# Patient Record
Sex: Female | Born: 1940 | Race: White | Hispanic: No | Marital: Married | Smoking: Former smoker
Health system: Southern US, Community
[De-identification: ages and names within clinical notes are randomized; demographics above are authoritative.]

## PROBLEM LIST (undated history)

## (undated) DIAGNOSIS — E785 Hyperlipidemia, unspecified: Secondary | ICD-10-CM

## (undated) DIAGNOSIS — I214 Non-ST elevation (NSTEMI) myocardial infarction: Secondary | ICD-10-CM

## (undated) DIAGNOSIS — R251 Tremor, unspecified: Secondary | ICD-10-CM

## (undated) DIAGNOSIS — C3490 Malignant neoplasm of unspecified part of unspecified bronchus or lung: Secondary | ICD-10-CM

## (undated) DIAGNOSIS — J449 Chronic obstructive pulmonary disease, unspecified: Secondary | ICD-10-CM

## (undated) DIAGNOSIS — I447 Left bundle-branch block, unspecified: Secondary | ICD-10-CM

## (undated) DIAGNOSIS — G250 Essential tremor: Principal | ICD-10-CM

## (undated) DIAGNOSIS — R918 Other nonspecific abnormal finding of lung field: Secondary | ICD-10-CM

## (undated) DIAGNOSIS — Z720 Tobacco use: Secondary | ICD-10-CM

## (undated) HISTORY — DX: Tobacco use: Z72.0

## (undated) HISTORY — DX: Hyperlipidemia, unspecified: E78.5

## (undated) HISTORY — DX: Chronic obstructive pulmonary disease, unspecified: J44.9

## (undated) HISTORY — DX: Essential tremor: G25.0

## (undated) HISTORY — DX: Non-ST elevation (NSTEMI) myocardial infarction: I21.4

## (undated) HISTORY — PX: HEEL SPUR SURGERY: SHX665

## (undated) HISTORY — DX: Malignant neoplasm of unspecified part of unspecified bronchus or lung: C34.90

## (undated) HISTORY — DX: Left bundle-branch block, unspecified: I44.7

## (undated) HISTORY — PX: BREAST BIOPSY: SHX20

## (undated) HISTORY — PX: OTHER SURGICAL HISTORY: SHX169

## (undated) HISTORY — PX: CHOLECYSTECTOMY: SHX55

## (undated) HISTORY — PX: TUBAL LIGATION: SHX77

---

## 2001-03-16 ENCOUNTER — Ambulatory Visit (HOSPITAL_COMMUNITY): Admission: RE | Admit: 2001-03-16 | Discharge: 2001-03-16 | Payer: Self-pay | Admitting: Pulmonary Disease

## 2001-03-30 ENCOUNTER — Ambulatory Visit (HOSPITAL_COMMUNITY): Admission: RE | Admit: 2001-03-30 | Discharge: 2001-03-30 | Payer: Self-pay | Admitting: Pulmonary Disease

## 2002-05-22 ENCOUNTER — Ambulatory Visit (HOSPITAL_COMMUNITY): Admission: RE | Admit: 2002-05-22 | Discharge: 2002-05-22 | Payer: Self-pay | Admitting: Pulmonary Disease

## 2009-06-10 ENCOUNTER — Ambulatory Visit (HOSPITAL_COMMUNITY): Admission: RE | Admit: 2009-06-10 | Discharge: 2009-06-10 | Payer: Self-pay | Admitting: Pulmonary Disease

## 2009-06-16 ENCOUNTER — Ambulatory Visit (HOSPITAL_COMMUNITY): Admission: RE | Admit: 2009-06-16 | Discharge: 2009-06-16 | Payer: Self-pay | Admitting: Pulmonary Disease

## 2010-06-18 ENCOUNTER — Ambulatory Visit (HOSPITAL_COMMUNITY): Admission: RE | Admit: 2010-06-18 | Discharge: 2010-06-18 | Payer: Self-pay | Admitting: Pulmonary Disease

## 2011-06-09 ENCOUNTER — Other Ambulatory Visit (HOSPITAL_COMMUNITY): Payer: Self-pay | Admitting: Pulmonary Disease

## 2011-06-09 DIAGNOSIS — Z139 Encounter for screening, unspecified: Secondary | ICD-10-CM

## 2011-06-21 ENCOUNTER — Ambulatory Visit (HOSPITAL_COMMUNITY)
Admission: RE | Admit: 2011-06-21 | Discharge: 2011-06-21 | Disposition: A | Discharge status: Home / Self Care | Payer: Medicare FFS | Source: Ambulatory Visit | Attending: Pulmonary Disease | Admitting: Pulmonary Disease

## 2011-06-21 DIAGNOSIS — Z1231 Encounter for screening mammogram for malignant neoplasm of breast: Secondary | ICD-10-CM | POA: Insufficient documentation

## 2011-06-21 DIAGNOSIS — Z139 Encounter for screening, unspecified: Secondary | ICD-10-CM

## 2012-01-07 ENCOUNTER — Encounter: Payer: Self-pay | Admitting: Cardiology

## 2012-01-09 DIAGNOSIS — R079 Chest pain, unspecified: Secondary | ICD-10-CM | POA: Insufficient documentation

## 2012-01-09 DIAGNOSIS — J449 Chronic obstructive pulmonary disease, unspecified: Secondary | ICD-10-CM | POA: Insufficient documentation

## 2012-01-09 DIAGNOSIS — E785 Hyperlipidemia, unspecified: Secondary | ICD-10-CM | POA: Insufficient documentation

## 2012-01-10 ENCOUNTER — Ambulatory Visit (INDEPENDENT_AMBULATORY_CARE_PROVIDER_SITE_OTHER): Payer: Medicare FFS | Admitting: Cardiology

## 2012-01-10 ENCOUNTER — Ambulatory Visit (HOSPITAL_COMMUNITY)
Admission: RE | Admit: 2012-01-10 | Discharge: 2012-01-10 | Disposition: A | Discharge status: Home / Self Care | Payer: Medicare FFS | Source: Ambulatory Visit | Attending: Cardiology | Admitting: Cardiology

## 2012-01-10 ENCOUNTER — Encounter: Payer: Self-pay | Admitting: Cardiology

## 2012-01-10 VITALS — BP 172/83 | HR 72 | Resp 16 | Ht 66.0 in | Wt 139.0 lb

## 2012-01-10 DIAGNOSIS — Z720 Tobacco use: Secondary | ICD-10-CM

## 2012-01-10 DIAGNOSIS — R0789 Other chest pain: Secondary | ICD-10-CM

## 2012-01-10 DIAGNOSIS — R0781 Pleurodynia: Secondary | ICD-10-CM

## 2012-01-10 DIAGNOSIS — R079 Chest pain, unspecified: Secondary | ICD-10-CM

## 2012-01-10 DIAGNOSIS — E785 Hyperlipidemia, unspecified: Secondary | ICD-10-CM

## 2012-01-10 DIAGNOSIS — Z0189 Encounter for other specified special examinations: Secondary | ICD-10-CM

## 2012-01-10 DIAGNOSIS — I447 Left bundle-branch block, unspecified: Secondary | ICD-10-CM

## 2012-01-10 DIAGNOSIS — F17200 Nicotine dependence, unspecified, uncomplicated: Secondary | ICD-10-CM

## 2012-01-10 NOTE — Progress Notes (Signed)
Name: Alicia Huynh    DOB: April 20, 1941  Age: 71 y.o.  MR#: [id number]       PCP:  Fredirick Maudlin, MD, MD      Insurance: @PAYORNAME @   CC:    Chief Complaint  Patient presents with  . Chest and arm pain    Referral from [facility] due to cv risk factors -meds/list    VS BP 172/83  Pulse 72  Resp 16  Ht 5\' 6"  (1.676 m)  Wt 139 lb (63.05 kg)  BMI 22.44 kg/m2  Weights Current Weight  01/10/12 139 lb (63.05 kg)    Blood Pressure  BP Readings from Last 3 Encounters:  01/10/12 172/83     Admit date:  (Not on file) Last encounter with RMR:  01/07/2012   Allergy Allergies  Allergen Reactions  . Codeine     Current Outpatient Prescriptions  Medication Sig Dispense Refill  . aspirin 81 MG tablet Take 81 mg by mouth daily.      . fluticasone (CUTIVATE) 0.05 % cream Apply topically as needed.      . pravastatin (PRAVACHOL) 40 MG tablet Take 40 mg by mouth daily.        Discontinued Meds:   There are no discontinued medications.  Patient Active Problem List  Diagnoses  . Hyperlipidemia  . COPD (chronic obstructive pulmonary disease)  . Chest pain    LABS No results found for any previous visit.   Results for this Opt Visit:    No results found for this or any previous visit.  EKG No orders found for this or any previous visit.   Prior Assessment and Plan Problem List as of 01/10/2012          Cardiology Problems   Hyperlipidemia     Other   COPD (chronic obstructive pulmonary disease)   Chest pain       Imaging: No results found.   FRS Calculation: Score not calculated. Missing: Total Cholesterol, HDL

## 2012-01-10 NOTE — Patient Instructions (Addendum)
Your physician recommends that you schedule a follow-up appointment in: AS NEEDED  STOP SMOKING  LEFT RIB X RAY

## 2012-01-11 ENCOUNTER — Encounter: Payer: Self-pay | Admitting: Cardiology

## 2012-01-11 ENCOUNTER — Other Ambulatory Visit: Payer: Self-pay | Admitting: *Deleted

## 2012-01-11 DIAGNOSIS — Z720 Tobacco use: Secondary | ICD-10-CM | POA: Insufficient documentation

## 2012-01-11 DIAGNOSIS — Z0189 Encounter for other specified special examinations: Secondary | ICD-10-CM | POA: Insufficient documentation

## 2012-01-11 DIAGNOSIS — I447 Left bundle-branch block, unspecified: Secondary | ICD-10-CM | POA: Insufficient documentation

## 2012-01-11 DIAGNOSIS — R9389 Abnormal findings on diagnostic imaging of other specified body structures: Secondary | ICD-10-CM

## 2012-01-11 NOTE — Assessment & Plan Note (Signed)
The presence of LBBB indicates a component of conduction system disease and is associated with a somewhat higher risk of coronary disease; however, additional testing is not warranted.

## 2012-01-11 NOTE — Progress Notes (Signed)
Patient ID: Alicia Huynh, female   DOB: [birth year], 71 y.o.   MRN: [id number]  HPI: Initial cardiology evaluation performed at the kind request of Dr. Juanetta Gosling for evaluation of multiple cardiovascular risk factors.  Patient's family members encouraged assessment in light of their concerns regarding coronary disease.  Symptomatically, patient is doing fine.  She performs her usual activities including housework without difficulty.  She continues to smoke one pack of cigarettes per day, but has no symptoms to suggest chronic bronchitis.  She has had long-standing localized left chest pain that increases with movement of the trunk and that emanates from the inframammary region where there is tender to palpation.  Current Outpatient Prescriptions on File Prior to Visit  Medication Sig Dispense Refill  . pravastatin (PRAVACHOL) 40 MG tablet Take 40 mg by mouth daily.       Allergies  Allergen Reactions  . Codeine      Past Medical History  Diagnosis Date  . Hyperlipidemia   . COPD (chronic obstructive pulmonary disease)   . Chest pain     Past Surgical History  Procedure Date  . None     Family History  Problem Relation Age of Onset  . Lymphoma Mother     47  . Heart attack Father     65  . Heart attack Brother     40 -Bypass X3. another Bypass at [facility] failed  . Heart defect Brother     heart surgery  . Heart attack Daughter     stents X3    History   Social History  . Marital Status: Married    Spouse Name: N/A    Number of Children: N/A  . Years of Education: N/A   Occupational History  . Not on file.   Social History Main Topics  . Smoking status: Current Everyday Smoker    Types: Cigarettes  . Smokeless tobacco: Not on file  . Alcohol Use: No  . Drug Use: No  . Sexually Active: Not on file   Other Topics Concern  . Not on file   Social History Narrative  . No narrative on file   ROS:  Denies orthopnea, PND, exertional dyspnea, palpitations, lightheadedness,  pedal edema or syncope.  All other systems reviewed and are negative.  PHYSICAL EXAM: BP 172/83  Pulse 72  Resp 16  Ht 5\' 6"  (1.676 m)  Wt 63.05 kg (139 lb)  BMI 22.44 kg/m2  General-Well-developed; no acute distress Body Habitus-proportionate weight and height [name]-[place]/AT; PERRL; EOM intact; conjunctiva and lids nl; dentures Neck-No JVD; no carotid bruits Endocrine-No thyromegaly Lungs-Clear lung fields; resonant percussion; normal I-to-E ratio Cardiovascular- normal PMI; normal S1 and S2 Abdomen-BS normal; soft and non-tender without masses or organomegaly Musculoskeletal-No deformities, cyanosis or clubbing Neurologic-Nl cranial nerves; symmetric strength and tone Skin- Warm, no significant lesions Extremities-Nl distal pulses; no edema  EKG:  Normal sinus rhythm; left bundle branch block; low voltage; no tracing for comparison.   ASSESSMENT AND PLAN:  Sam Rayburn Bing, MD 01/11/2012 1:03 PM

## 2012-01-11 NOTE — Assessment & Plan Note (Addendum)
Control of hyperlipidemia somewhat suboptimal, but adequate in light of the absence of known vascular disease.  Consideration could be given to increasing pravastatin to 80 mg per day or selecting a more potent agent.  I will leave this decision to Dr. Juanetta Gosling.

## 2012-01-11 NOTE — Assessment & Plan Note (Signed)
Chest pain is noncardiac.  Due to its chronicity, I doubt that a specific etiology will be identified.  I recommended treatment with over-the-counter nonsteroidals as needed.  Rib films will be obtained since there is tenderness in the region of the left 5th rib.

## 2012-01-11 NOTE — Assessment & Plan Note (Signed)
Patient strongly urged to discontinue cigarette smoking, but does not appear inclined to do so.

## 2012-01-13 ENCOUNTER — Other Ambulatory Visit: Payer: Self-pay | Admitting: *Deleted

## 2012-01-13 DIAGNOSIS — R9389 Abnormal findings on diagnostic imaging of other specified body structures: Secondary | ICD-10-CM

## 2012-01-14 ENCOUNTER — Ambulatory Visit (HOSPITAL_COMMUNITY): Payer: Medicare FFS

## 2012-01-18 ENCOUNTER — Encounter: Payer: Self-pay | Admitting: *Deleted

## 2012-01-18 ENCOUNTER — Ambulatory Visit (HOSPITAL_COMMUNITY)
Admission: RE | Admit: 2012-01-18 | Discharge: 2012-01-18 | Disposition: A | Discharge status: Home / Self Care | Payer: Medicare FFS | Source: Ambulatory Visit | Attending: Cardiology | Admitting: Cardiology

## 2012-01-18 DIAGNOSIS — R911 Solitary pulmonary nodule: Secondary | ICD-10-CM | POA: Insufficient documentation

## 2012-01-18 LAB — BASIC METABOLIC PANEL
CO2: 27 mEq/L (ref 19–32)
Calcium: 10.5 mg/dL (ref 8.4–10.5)
Creat: 0.95 mg/dL (ref 0.50–1.10)
Glucose, Bld: 110 mg/dL — ABNORMAL HIGH (ref 70–99)

## 2012-01-18 MED ORDER — IOHEXOL 300 MG/ML  SOLN
80.0000 mL | Freq: Once | INTRAMUSCULAR | Status: AC | PRN
  Administered 2012-01-18: 80 mL via INTRAVENOUS

## 2012-01-20 ENCOUNTER — Telehealth: Payer: Self-pay | Admitting: Cardiology

## 2012-01-20 NOTE — Telephone Encounter (Signed)
Patient is calling for results of CT Scan done on 01/18/12. / tg

## 2012-01-21 ENCOUNTER — Other Ambulatory Visit (HOSPITAL_COMMUNITY): Payer: Self-pay | Admitting: Pulmonary Disease

## 2012-01-21 ENCOUNTER — Other Ambulatory Visit: Payer: Self-pay

## 2012-01-21 DIAGNOSIS — R911 Solitary pulmonary nodule: Secondary | ICD-10-CM

## 2012-01-21 DIAGNOSIS — J449 Chronic obstructive pulmonary disease, unspecified: Secondary | ICD-10-CM

## 2012-01-21 NOTE — Telephone Encounter (Signed)
Patient was seen by Dr Juanetta Gosling in the office yesterday and is scheduled for a PET scan.

## 2012-01-24 ENCOUNTER — Ambulatory Visit (HOSPITAL_COMMUNITY)
Admission: RE | Admit: 2012-01-24 | Discharge: 2012-01-24 | Disposition: A | Discharge status: Home / Self Care | Payer: Medicare FFS | Source: Ambulatory Visit | Attending: Pulmonary Disease | Admitting: Pulmonary Disease

## 2012-01-24 ENCOUNTER — Encounter: Payer: Self-pay | Admitting: Cardiology

## 2012-01-24 DIAGNOSIS — J4489 Other specified chronic obstructive pulmonary disease: Secondary | ICD-10-CM | POA: Insufficient documentation

## 2012-01-24 DIAGNOSIS — J449 Chronic obstructive pulmonary disease, unspecified: Secondary | ICD-10-CM | POA: Insufficient documentation

## 2012-01-24 DIAGNOSIS — R0989 Other specified symptoms and signs involving the circulatory and respiratory systems: Secondary | ICD-10-CM | POA: Insufficient documentation

## 2012-01-24 DIAGNOSIS — R911 Solitary pulmonary nodule: Secondary | ICD-10-CM | POA: Insufficient documentation

## 2012-01-24 DIAGNOSIS — R0609 Other forms of dyspnea: Secondary | ICD-10-CM | POA: Insufficient documentation

## 2012-01-24 DIAGNOSIS — R222 Localized swelling, mass and lump, trunk: Secondary | ICD-10-CM | POA: Insufficient documentation

## 2012-01-24 LAB — BLOOD GAS, ARTERIAL
Bicarbonate: 24.7 mEq/L — ABNORMAL HIGH (ref 20.0–24.0)
TCO2: 21.1 mmol/L (ref 0–100)
pCO2 arterial: 36.9 mmHg (ref 35.0–45.0)
pH, Arterial: 7.441 — ABNORMAL HIGH (ref 7.350–7.400)

## 2012-01-24 MED ORDER — ALBUTEROL SULFATE (5 MG/ML) 0.5% IN NEBU: 2.5000 mg | INHALATION_SOLUTION | Freq: Once | RESPIRATORY_TRACT | Status: DC

## 2012-01-26 NOTE — Procedures (Signed)
Alicia Huynh, Alicia Huynh                 ACCOUNT NO.:  [id number]  MEDICAL RECORD NO.:  [id number]  LOCATION:                                 FACILITY:  PHYSICIAN:  Austen Oyster L. Juanetta Gosling, M.D.DATE OF BIRTH:  22-Sep-1940  DATE OF PROCEDURE:  01/25/2012 DATE OF DISCHARGE:                           PULMONARY FUNCTION TEST   Reason for pulmonary function testing is COPD and lung mass.  1. Spirometry shows no ventilatory defect.  FVC is 3.35 with predicted     3.16.  FEV1 is 2.35 with predicted of 2.38.  FEV1 to FVC ratio is     70% with predicted of 75%.  There is some evidence of airflow     obstruction in the smaller airways. 2. Lung volumes are normal. 3. DLCO is normal. 4. Airway resistance is normal. 5. Arterial blood gas is normal. 6. There is improvement with inhaled bronchodilator, but it does not     reach the level of significance. 7. This study is consistent with COPD.     [name] L. Juanetta Gosling, M.D.     ELH/MEDQ  D:  01/25/2012  T:  01/25/2012  Job:  [id number]

## 2012-01-27 ENCOUNTER — Encounter (HOSPITAL_COMMUNITY): Payer: Self-pay

## 2012-01-27 ENCOUNTER — Encounter (HOSPITAL_COMMUNITY)
Admission: RE | Admit: 2012-01-27 | Discharge: 2012-01-27 | Disposition: A | Discharge status: Home / Self Care | Payer: Medicare FFS | Source: Ambulatory Visit | Attending: Pulmonary Disease | Admitting: Pulmonary Disease

## 2012-01-27 DIAGNOSIS — R911 Solitary pulmonary nodule: Secondary | ICD-10-CM

## 2012-01-27 DIAGNOSIS — J984 Other disorders of lung: Secondary | ICD-10-CM | POA: Insufficient documentation

## 2012-01-27 DIAGNOSIS — R222 Localized swelling, mass and lump, trunk: Secondary | ICD-10-CM | POA: Insufficient documentation

## 2012-01-27 LAB — GLUCOSE, CAPILLARY: Glucose-Capillary: 98 mg/dL (ref 70–99)

## 2012-01-27 MED ORDER — FLUDEOXYGLUCOSE F - 18 (FDG) INJECTION
17.5000 | Freq: Once | INTRAVENOUS | Status: AC | PRN
  Administered 2012-01-27: 17.5 via INTRAVENOUS

## 2012-02-01 ENCOUNTER — Other Ambulatory Visit (HOSPITAL_COMMUNITY): Payer: Self-pay | Admitting: Pulmonary Disease

## 2012-02-01 DIAGNOSIS — R918 Other nonspecific abnormal finding of lung field: Secondary | ICD-10-CM

## 2012-02-01 LAB — PULMONARY FUNCTION TEST

## 2012-02-02 ENCOUNTER — Other Ambulatory Visit: Payer: Self-pay | Admitting: Radiology

## 2012-02-07 ENCOUNTER — Ambulatory Visit (HOSPITAL_COMMUNITY)
Admission: RE | Admit: 2012-02-07 | Discharge: 2012-02-07 | Disposition: A | Discharge status: Home / Self Care | Payer: Medicare FFS | Source: Ambulatory Visit | Attending: Interventional Radiology | Admitting: Interventional Radiology

## 2012-02-07 ENCOUNTER — Encounter (HOSPITAL_COMMUNITY): Payer: Self-pay

## 2012-02-07 ENCOUNTER — Ambulatory Visit (HOSPITAL_COMMUNITY)
Admission: RE | Admit: 2012-02-07 | Discharge: 2012-02-07 | Disposition: A | Discharge status: Home / Self Care | Payer: Medicare FFS | Source: Ambulatory Visit | Attending: Pulmonary Disease | Admitting: Pulmonary Disease

## 2012-02-07 DIAGNOSIS — F17200 Nicotine dependence, unspecified, uncomplicated: Secondary | ICD-10-CM | POA: Insufficient documentation

## 2012-02-07 DIAGNOSIS — R079 Chest pain, unspecified: Secondary | ICD-10-CM | POA: Insufficient documentation

## 2012-02-07 DIAGNOSIS — J4489 Other specified chronic obstructive pulmonary disease: Secondary | ICD-10-CM | POA: Insufficient documentation

## 2012-02-07 DIAGNOSIS — I447 Left bundle-branch block, unspecified: Secondary | ICD-10-CM | POA: Insufficient documentation

## 2012-02-07 DIAGNOSIS — E785 Hyperlipidemia, unspecified: Secondary | ICD-10-CM | POA: Insufficient documentation

## 2012-02-07 DIAGNOSIS — J449 Chronic obstructive pulmonary disease, unspecified: Secondary | ICD-10-CM | POA: Insufficient documentation

## 2012-02-07 DIAGNOSIS — R918 Other nonspecific abnormal finding of lung field: Secondary | ICD-10-CM

## 2012-02-07 LAB — CBC
HCT: 43.8 % (ref 36.0–46.0)
MCH: 32.4 pg (ref 26.0–34.0)
MCHC: 34.9 g/dL (ref 30.0–36.0)
MCV: 92.8 fL (ref 78.0–100.0)
RDW: 13.3 % (ref 11.5–15.5)

## 2012-02-07 LAB — PROTIME-INR: INR: 1.05 (ref 0.00–1.49)

## 2012-02-07 MED ORDER — FENTANYL CITRATE 0.05 MG/ML IJ SOLN
INTRAMUSCULAR | Status: AC
  Filled 2012-02-07: qty 4

## 2012-02-07 MED ORDER — SODIUM CHLORIDE 0.9 % IV SOLN
Freq: Once | INTRAVENOUS | Status: AC
  Administered 2012-02-07: 07:00:00 via INTRAVENOUS

## 2012-02-07 MED ORDER — FENTANYL CITRATE 0.05 MG/ML IJ SOLN
INTRAMUSCULAR | Status: DC | PRN
  Administered 2012-02-07: 25 ug via INTRAVENOUS
  Administered 2012-02-07: 50 ug via INTRAVENOUS
  Administered 2012-02-07: 25 ug via INTRAVENOUS

## 2012-02-07 MED ORDER — MIDAZOLAM HCL 2 MG/2ML IJ SOLN
INTRAMUSCULAR | Status: AC
  Filled 2012-02-07: qty 4

## 2012-02-07 MED ORDER — MIDAZOLAM HCL 5 MG/5ML IJ SOLN
INTRAMUSCULAR | Status: DC | PRN
  Administered 2012-02-07 (×2): 1 mg via INTRAVENOUS

## 2012-02-07 NOTE — Discharge Instructions (Signed)
Needle Biopsy of Lung °Care After °A needle biopsy is a procedure to get a sample of cells from your body for testing. A needle biopsy may be used to take tissue or fluid samples from muscles, bones and organs, such as the liver or lungs. °The sample from your needle biopsy may help your doctor determine what is causing: °· A mass or lump. A needle biopsy may reveal whether a mass or lump is a cyst, an infection, a benign tumor or cancer.  °· Infection. Tests from a needle biopsy can help doctors determine what germs are causing an infection so that the best medicines can be used for treatment.  °· Inflammation. Looking closely at a needle biopsy sample may reveal what is causing inflammation and what types of cells are involved.  °Your caregiver has now completed a needle biopsy of the lung to help diagnose a medical condition or to rule out a disease or condition. A needle biopsy may also be used to assess the progress of a treatment. °AFTER THE PROCEDURE °Once your caregiver has collected enough cells or tissue for analysis, your needle biopsy procedure is complete. Your biopsy sample is sent to a laboratory to be tested. The results may be available in a day or two. More technical tests may require more time. Ask your caregiver how long you can expect to wait.  °Your health care team may apply a bandage over the areas where the needle was inserted. You may be asked to apply pressure to the bandage for several minutes to ensure there is minimal bleeding. °In most cases, you can leave when your needle biopsy procedure is completed. Do not drive yourself home. Someone else should take you home. Whether you can leave right away or whether you will need to stay for observation depends on how you feel and the exam by your caregiver after the biopsy. In some cases your health care team may want to observe you for a few hours to ensure you do not have complications from your biopsy. If you received an IV sedative or  general anesthetic, you will be taken to a comfortable place to relax while the medication wears off. °Expect to take it easy for the rest of the day. Protect the area where you received the needle biopsy by keeping the bandage in place for as long as instructed. You may feel some mild pain or discomfort in the area, but this should stop in a day or two. Only take over-the-counter or prescription medicines for pain, discomfort, or fever as directed by your caregiver. °SEEK MEDICAL CARE IF:  °· You have pain at the biopsy site that worsens or is not helped by medicines.  °· You have swelling at the needle biopsy site.  °· You have drainage from the biopsy site.  °· You have new or unusual pain in your back or at the top of one or both shoulders.  °SEEK IMMEDIATE MEDICAL CARE IF:  °· You have a fever.  °· You develop lightheadedness or fainting.  °· You have chest pain or shortness of breath.  °· You have bleeding that does not stop with pressure or a bandage.  °· You have weakness or numbness in your legs.  °Document Released: 07/04/2007 Document Revised: 08/26/2011 Document Reviewed: 07/04/2007 °Exit[employer]® Patient Information ©2012 [employer]. °

## 2012-02-07 NOTE — Procedures (Signed)
CT core RUL lung lesion bx 18g x3 No ptx. Regional alveolar hemorrhage. Pt stable. No blood loss. See complete dictation in [facility].

## 2012-02-07 NOTE — H&P (Signed)
Chief Complaint: R lung mass HPI: Alicia Huynh is an 71 y.o. female found to have a Rt lung nodule on routine CXR for COPD. She then had CT and subsequent PET which suspected malignant lesion. She is scheduled with IR for CT guided biopsy at the request of Dr. Juanetta Gosling.  Past Medical History:  Past Medical History  Diagnosis Date  . Hyperlipidemia   . COPD (chronic obstructive pulmonary disease)   . Chest pain   . Left bundle branch block   . Tobacco abuse     50 pack years; one pack per day    Past Surgical History:  Past Surgical History  Procedure Date  . None     Family History:  Family History  Problem Relation Age of Onset  . Lymphoma Mother     48  . Heart attack Father     42  . Heart attack Brother     40 -Bypass X3. another Bypass at [facility] failed  . Heart defect Brother     heart surgery  . Heart attack Daughter     stents X3    Social History:  reports that she has been smoking Cigarettes.  She does not have any smokeless tobacco history on file. She reports that she does not drink alcohol or use illicit drugs.  Allergies:  Allergies  Allergen Reactions  . Codeine Shortness Of Breath    Medications: Current Outpatient Prescriptions   .  aspirin 81 MG tablet  Take 81 mg by mouth daily.  Hasn't taken in 1 week.   .  fluticasone (CUTIVATE) 0.05 % cream  Apply topically as needed.     .  pravastatin (PRAVACHOL) 40 MG tablet  Take 40 mg by mouth daily.       Please HPI for pertinent positives, otherwise complete 10 system ROS negative.  Physical Exam: Blood pressure 152/86, pulse 72, temperature 96.8 F (36 C), temperature source Oral, resp. rate 18, height 5\' 6"  (1.676 m), weight 135 lb (61.236 kg), SpO2 98.00%. Body mass index is 21.79 kg/(m^2).   General Appearance:  Alert, cooperative, no distress, appears stated age  Head:  Normocephalic, without obvious abnormality, atraumatic  ENT: Unremarkable  Neck: Supple, symmetrical, trachea midline, no  adenopathy, thyroid: not enlarged, symmetric, no tenderness/mass/nodules  Lungs:   Clear to auscultation bilaterally, no w/r/r, respirations unlabored without use of accessory muscles.  Chest Wall:  No tenderness or deformity  Heart:  Regular rate and rhythm, S1, S2 normal, no murmur, rub or gallop. Carotids 2+ without bruit.  Abdomen:   Soft, non-tender, non distended. Bowel sounds active all four quadrants,  no masses, no organomegaly.  Extremities: Extremities normal, atraumatic, no cyanosis or edema  Pulses: 2+ and symmetric  Skin: Skin color, texture, turgor normal, no rashes or lesions  Neurologic: Normal affect, no gross deficits.   Results for orders placed during the hospital encounter of 02/07/12 (from the past 48 hour(s))  APTT     Status: Normal   Collection Time   02/07/12  7:20 AM      Component Value Range Comment   aPTT 29  24 - 37 (seconds)   CBC     Status: Abnormal   Collection Time   02/07/12  7:20 AM      Component Value Range Comment   WBC 8.3  4.0 - 10.5 (K/uL)    RBC 4.72  3.87 - 5.11 (MIL/uL)    Hemoglobin 15.3 (*) 12.0 - 15.0 (g/dL)  HCT 43.8  36.0 - 46.0 (%)    MCV 92.8  78.0 - 100.0 (fL)    MCH 32.4  26.0 - 34.0 (pg)    MCHC 34.9  30.0 - 36.0 (g/dL)    RDW 16.1  09.6 - 04.5 (%)    Platelets 162  150 - 400 (K/uL)   PROTIME-INR     Status: Normal   Collection Time   02/07/12  7:20 AM      Component Value Range Comment   Prothrombin Time 13.9  11.6 - 15.2 (seconds)    INR 1.05  0.00 - 1.49     No results found.  Assessment/Plan Rt upper lung nodule/mass For CT guided biopsy today. I have discussed the procedure including risks and complications with pt and family. Labs ok. Consent signed in chart.  Brayton El PA-C 02/07/2012, 8:22 AM

## 2012-02-09 ENCOUNTER — Telehealth (HOSPITAL_COMMUNITY): Payer: Self-pay | Admitting: *Deleted

## 2012-02-09 NOTE — Telephone Encounter (Signed)
Post procedure follow up call. Spoke with pt, says doing well no problems at this time.

## 2012-02-17 ENCOUNTER — Other Ambulatory Visit: Payer: Self-pay

## 2012-02-17 ENCOUNTER — Institutional Professional Consult (permissible substitution) (INDEPENDENT_AMBULATORY_CARE_PROVIDER_SITE_OTHER): Payer: Medicare FFS | Admitting: Thoracic Surgery (Cardiothoracic Vascular Surgery)

## 2012-02-17 ENCOUNTER — Encounter: Payer: Self-pay | Admitting: Thoracic Surgery (Cardiothoracic Vascular Surgery)

## 2012-02-17 VITALS — BP 150/83 | HR 71 | Temp 97.1°F | Resp 18 | Ht 66.0 in | Wt 135.0 lb

## 2012-02-17 DIAGNOSIS — C3410 Malignant neoplasm of upper lobe, unspecified bronchus or lung: Secondary | ICD-10-CM

## 2012-02-17 DIAGNOSIS — D381 Neoplasm of uncertain behavior of trachea, bronchus and lung: Secondary | ICD-10-CM

## 2012-02-17 NOTE — Progress Notes (Signed)
PCP is Fredirick Maudlin, MD, MD Referring Provider is Fredirick Maudlin, MD  Chief Complaint  Patient presents with  . Lung Cancer    MTOC/ Eval & TX    HPI: 71 year old woman with a 50-pack-year history of smoking since chief complaint of the lung cancer.  Alicia Huynh is a 71 year old woman with a history of tobacco abuse. She recently was having some left-sided chest pain. Because of family concerns with a history of coronary disease she went to see Alicia Huynh and Alicia Huynh. They found no evidence of coronary disease, but a chest x-ray did show a right upper lobe nodule. This led to a CT of the chest which showed 1.7 x 1.2 cm mass in the right upper lobe. There is no hilar or mediastinal adenopathy. A PET CT then was done which showed the lesion to be hypermetabolic with an SUV max of 13.1. 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  She states that she's been feeling well. Her weight has been stable. She has an occasional cough. She denies hemoptysis, shortness of breath, wheezing, loss of appetite.  Past Medical History  Diagnosis Date  . Hyperlipidemia   . COPD (chronic obstructive pulmonary disease)   . Chest pain   . Left bundle branch block   . Tobacco abuse     50 pack years; one pack per day    Past Surgical History  Procedure Date  . None     Family History  Problem Relation Age of Onset  . Lymphoma Mother     82  . Heart attack Father     60  . Heart attack Brother     40 -Bypass X3. another Bypass at [facility] failed  . Heart defect Brother     heart surgery  . Heart attack Daughter     stents X3    Social History History  Substance Use Topics  . Smoking status: Former Smoker -- 1.0 packs/day for 50 years    Types: Cigarettes    Quit date: 02/03/2012  . Smokeless tobacco: Never Used  . Alcohol Use: No    Current Outpatient Prescriptions  Medication Sig Dispense Refill  . aspirin 81 MG tablet Take 81 mg by mouth daily.      .  cyclobenzaprine (FLEXERIL) 10 MG tablet Take 10 mg by mouth 2 (two) times daily as needed. For muscle pain      . fluticasone (CUTIVATE) 0.05 % cream Apply topically as needed.      . pravastatin (PRAVACHOL) 40 MG tablet Take 40 mg by mouth daily.      . traMADol (ULTRAM) 50 MG tablet Take 50 mg by mouth every 6 (six) hours as needed. For pain        Allergies  Allergen Reactions  . Codeine Shortness Of Breath    Review of Systems  Constitutional: Negative for fever, chills and unexpected weight change.  Respiratory: Positive for cough. Negative for chest tightness, shortness of breath and wheezing.   Cardiovascular: Positive for chest pain (left sided, constant, now resolved). Negative for leg swelling.  Gastrointestinal: Negative.   Genitourinary: Negative.   Musculoskeletal: Negative.   Neurological: Negative.   Hematological: Negative.   All other systems reviewed and are negative.    BP 150/83  Pulse 71  Temp(Src) 97.1 F (36.2 C) (Oral)  Resp 18  Ht 5\' 6"  (1.676 m)  Wt 135 lb (61.236 kg)  BMI 21.79 kg/m2  SpO2 98% Physical Exam  Vitals  reviewed. Constitutional: She is oriented to person, place, and time. She appears well-developed and well-nourished. No distress.  HENT:  Head: Normocephalic and atraumatic.  Eyes: EOM are normal. Pupils are equal, round, and reactive to light.  Neck: Normal range of motion. Neck supple. No thyromegaly present.  Cardiovascular: Normal rate, regular rhythm and intact distal pulses.  Exam reveals no gallop and no friction rub.   No murmur heard. Pulmonary/Chest: Effort normal and breath sounds normal. She has no wheezes. She has no rales.  Abdominal: Soft. There is no tenderness.  Musculoskeletal: She exhibits no edema.  Lymphadenopathy:    She has no cervical adenopathy.  Neurological: She is oriented to person, place, and time. No cranial nerve deficit.  Skin: Skin is warm and dry.     Diagnostic Tests: CT Chest  01/18/12 IMPRESSION:  1. No acute cardiopulmonary abnormalities.  2. Pulmonary nodule in the right upper lobe is suspicious for  primary pulmonary neoplasm. Recommend further evaluation with PET-  CT.   PET/CT 02/06/12  IMPRESSION:  Hypermetabolic right upper lobe nodule is most consistent with  primary bronchogenic carcinoma. Imaging findings are indicative of  stage IA disease.   Lung, biopsy, Right lung 02/07/12 - SQUAMOUS CELL CARCINOMA. - SEE COMMENT.  PFTs- FEV1 2.35(98% predicted)  Impression: 71 year old smoker with a new right upper lobe mass. 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 She is otherwise healthy and very active. She has essentially normal pulmonary function testing. She is a good operative candidate.  I discussed with the patient and her family indications, risks, benefits, and alternatives (radiation therapy) for treatment of the lung cancer. They understand the surgery give the best chance for cure. I discussed with them the nature of the surgery including the need for general anesthesia, incisions to be used, preference for a minimally invasive approach, expected hospital stay, and overall recovery. They understand the risk of surgery include but are not limited to death, MI, DVT, PE, bleeding, possible need for transfusion, infection, prolonged air leak, as well as other unforeseeable complications. She understands and accepts these risks and wishes to proceed with right vats and right upper lobectomy. She does understand that the final staging will depend on her surgical pathology an additional treatment might be necessary if there is lymph node involvement.  Plan: Right VATS, right upper lobectomy Thursday, June 6. She will be admitted the day of surgery.

## 2012-02-18 ENCOUNTER — Encounter (HOSPITAL_COMMUNITY): Payer: Self-pay | Admitting: Pharmacy Technician

## 2012-02-19 DIAGNOSIS — R918 Other nonspecific abnormal finding of lung field: Secondary | ICD-10-CM

## 2012-02-19 DIAGNOSIS — I214 Non-ST elevation (NSTEMI) myocardial infarction: Secondary | ICD-10-CM

## 2012-02-19 DIAGNOSIS — C3490 Malignant neoplasm of unspecified part of unspecified bronchus or lung: Secondary | ICD-10-CM | POA: Insufficient documentation

## 2012-02-19 HISTORY — DX: Non-ST elevation (NSTEMI) myocardial infarction: I21.4

## 2012-02-19 HISTORY — DX: Malignant neoplasm of unspecified part of unspecified bronchus or lung: C34.90

## 2012-02-19 HISTORY — DX: Other nonspecific abnormal finding of lung field: R91.8

## 2012-02-21 ENCOUNTER — Inpatient Hospital Stay (HOSPITAL_COMMUNITY): Admission: RE | Admit: 2012-02-21 | Discharge: 2012-02-21 | Payer: Medicare FFS | Source: Ambulatory Visit

## 2012-02-21 ENCOUNTER — Encounter (HOSPITAL_COMMUNITY): Payer: Self-pay | Admitting: *Deleted

## 2012-02-21 NOTE — Pre-Procedure Instructions (Signed)
18 Alicia Huynh  02/21/2012   Your procedure is scheduled on:  Thurs, June 6 @ 7:30 AM  Report to [facility] at 5:30 AM.  Call this number if you have problems the morning of surgery: [phone]   Remember:   Do not eat food:After Midnight.  May have clear liquids: up to 4 Hours before arrival.(until 1:30 AM)  Clear liquids include soda, tea, black coffee, apple or grape juice, broth,water  Take these medicines the morning of surgery with A SIP OF WATER:    Do not wear jewelry, make-up or nail polish.  Do not wear lotions, powders, or perfumes.   Do not shave 48 hours prior to surgery.   Do not bring valuables to the hospital.  Contacts, dentures or bridgework may not be worn into surgery.  Leave suitcase in the car. After surgery it may be brought to your room.  For patients admitted to the hospital, checkout time is 11:00 AM the day of discharge.   Special Instructions: CHG Shower Use Special Wash: 1/2 bottle night before surgery and 1/2 bottle morning of surgery.   Please read over the following fact sheets that you were given: Pain Booklet, Coughing and Deep Breathing, Blood Transfusion Information, MRSA Information and Surgical Site Infection Prevention

## 2012-02-21 NOTE — Progress Notes (Signed)
Saw Dr.Rothbart in 01/10/12(only saw to clear childrens mind prior to surgery)  Denies ever having an echo/stress test/heart cath  Medical MD is Dr.[facility]

## 2012-02-21 NOTE — Progress Notes (Signed)
Verified lab appointment time with pt for 02/22/12 @1330 

## 2012-02-22 ENCOUNTER — Encounter (HOSPITAL_COMMUNITY): Payer: Self-pay

## 2012-02-22 ENCOUNTER — Ambulatory Visit (HOSPITAL_COMMUNITY)
Admission: RE | Admit: 2012-02-22 | Discharge: 2012-02-22 | Disposition: A | Discharge status: Home / Self Care | Payer: Medicare FFS | Source: Ambulatory Visit | Attending: Thoracic Surgery (Cardiothoracic Vascular Surgery) | Admitting: Thoracic Surgery (Cardiothoracic Vascular Surgery)

## 2012-02-22 ENCOUNTER — Encounter (HOSPITAL_COMMUNITY)
Admission: RE | Admit: 2012-02-22 | Discharge: 2012-02-22 | Disposition: A | Discharge status: Home / Self Care | Payer: Medicare FFS | Source: Ambulatory Visit | Attending: Thoracic Surgery (Cardiothoracic Vascular Surgery) | Admitting: Thoracic Surgery (Cardiothoracic Vascular Surgery)

## 2012-02-22 VITALS — BP 161/82 | HR 60 | Temp 97.2°F | Resp 20 | Ht 66.0 in | Wt 130.3 lb

## 2012-02-22 DIAGNOSIS — R911 Solitary pulmonary nodule: Secondary | ICD-10-CM | POA: Insufficient documentation

## 2012-02-22 DIAGNOSIS — D381 Neoplasm of uncertain behavior of trachea, bronchus and lung: Secondary | ICD-10-CM

## 2012-02-22 DIAGNOSIS — R222 Localized swelling, mass and lump, trunk: Secondary | ICD-10-CM | POA: Insufficient documentation

## 2012-02-22 LAB — URINALYSIS, ROUTINE W REFLEX MICROSCOPIC
Nitrite: NEGATIVE
Specific Gravity, Urine: 1.008 (ref 1.005–1.030)
Urobilinogen, UA: 0.2 mg/dL (ref 0.0–1.0)
pH: 6 (ref 5.0–8.0)

## 2012-02-22 LAB — URINE MICROSCOPIC-ADD ON

## 2012-02-22 LAB — COMPREHENSIVE METABOLIC PANEL
Alkaline Phosphatase: 62 U/L (ref 39–117)
BUN: 15 mg/dL (ref 6–23)
CO2: 20 mEq/L (ref 19–32)
Chloride: 108 mEq/L (ref 96–112)
Creatinine, Ser: 0.72 mg/dL (ref 0.50–1.10)
GFR calc Af Amer: >90 mL/min (ref >90)
GFR calc non Af Amer: 84 mL/min — ABNORMAL LOW (ref >90)
Glucose, Bld: 89 mg/dL (ref 70–99)
Potassium: 4.1 mEq/L (ref 3.5–5.1)
Total Bilirubin: 0.4 mg/dL (ref 0.3–1.2)

## 2012-02-22 LAB — BLOOD GAS, ARTERIAL
Acid-base deficit: 1.1 mmol/L (ref 0.0–2.0)
Bicarbonate: 22.7 mEq/L (ref 20.0–24.0)
TCO2: 23.8 mmol/L (ref 0–100)
pCO2 arterial: 35.3 mmHg (ref 35.0–45.0)
pH, Arterial: 7.424 — ABNORMAL HIGH (ref 7.350–7.400)

## 2012-02-22 LAB — CBC
HCT: 40.9 % (ref 36.0–46.0)
Hemoglobin: 14.3 g/dL (ref 12.0–15.0)
MCV: 90.9 fL (ref 78.0–100.0)
WBC: 8.3 10*3/uL (ref 4.0–10.5)

## 2012-02-22 LAB — SURGICAL PCR SCREEN
MRSA, PCR: NEGATIVE
Staphylococcus aureus: NEGATIVE

## 2012-02-22 LAB — PROTIME-INR
INR: 1.06 (ref 0.00–1.49)
Prothrombin Time: 14 seconds (ref 11.6–15.2)

## 2012-02-22 LAB — ABO/RH: ABO/RH(D): AB NEG

## 2012-02-23 MED ORDER — DEXTROSE 5 % IV SOLN
1.5000 g | INTRAVENOUS | Status: AC
  Administered 2012-02-24: 1.5 g via INTRAVENOUS
  Filled 2012-02-23: qty 1.5

## 2012-02-24 ENCOUNTER — Encounter (HOSPITAL_COMMUNITY): Payer: Self-pay | Admitting: *Deleted

## 2012-02-24 ENCOUNTER — Inpatient Hospital Stay (HOSPITAL_COMMUNITY)
Admission: RE | Admit: 2012-02-24 | Discharge: 2012-03-03 | DRG: 163 | Disposition: A | Discharge status: Home / Self Care | Payer: Medicare FFS | Source: Ambulatory Visit | Attending: Thoracic Surgery (Cardiothoracic Vascular Surgery) | Admitting: Thoracic Surgery (Cardiothoracic Vascular Surgery)

## 2012-02-24 ENCOUNTER — Encounter (HOSPITAL_COMMUNITY): Payer: Self-pay | Admitting: Certified Registered Nurse Anesthetist

## 2012-02-24 ENCOUNTER — Ambulatory Visit (HOSPITAL_COMMUNITY): Payer: Medicare FFS | Admitting: Certified Registered Nurse Anesthetist

## 2012-02-24 ENCOUNTER — Inpatient Hospital Stay (HOSPITAL_COMMUNITY): Payer: Medicare FFS

## 2012-02-24 ENCOUNTER — Encounter (HOSPITAL_COMMUNITY)
Admission: RE | Disposition: A | Discharge status: Home / Self Care | Payer: Self-pay | Source: Ambulatory Visit | Attending: Thoracic Surgery (Cardiothoracic Vascular Surgery)

## 2012-02-24 DIAGNOSIS — D381 Neoplasm of uncertain behavior of trachea, bronchus and lung: Secondary | ICD-10-CM

## 2012-02-24 DIAGNOSIS — I447 Left bundle-branch block, unspecified: Secondary | ICD-10-CM | POA: Diagnosis present

## 2012-02-24 DIAGNOSIS — R911 Solitary pulmonary nodule: Secondary | ICD-10-CM | POA: Insufficient documentation

## 2012-02-24 DIAGNOSIS — R112 Nausea with vomiting, unspecified: Secondary | ICD-10-CM | POA: Diagnosis not present

## 2012-02-24 DIAGNOSIS — Z87891 Personal history of nicotine dependence: Secondary | ICD-10-CM

## 2012-02-24 DIAGNOSIS — I214 Non-ST elevation (NSTEMI) myocardial infarction: Secondary | ICD-10-CM | POA: Diagnosis not present

## 2012-02-24 DIAGNOSIS — Z807 Family history of other malignant neoplasms of lymphoid, hematopoietic and related tissues: Secondary | ICD-10-CM

## 2012-02-24 DIAGNOSIS — C3410 Malignant neoplasm of upper lobe, unspecified bronchus or lung: Principal | ICD-10-CM | POA: Diagnosis present

## 2012-02-24 DIAGNOSIS — E785 Hyperlipidemia, unspecified: Secondary | ICD-10-CM | POA: Diagnosis present

## 2012-02-24 DIAGNOSIS — Z7982 Long term (current) use of aspirin: Secondary | ICD-10-CM

## 2012-02-24 DIAGNOSIS — Z8249 Family history of ischemic heart disease and other diseases of the circulatory system: Secondary | ICD-10-CM

## 2012-02-24 DIAGNOSIS — K5900 Constipation, unspecified: Secondary | ICD-10-CM | POA: Diagnosis not present

## 2012-02-24 DIAGNOSIS — Z79899 Other long term (current) drug therapy: Secondary | ICD-10-CM

## 2012-02-24 DIAGNOSIS — J4489 Other specified chronic obstructive pulmonary disease: Secondary | ICD-10-CM | POA: Diagnosis present

## 2012-02-24 DIAGNOSIS — J449 Chronic obstructive pulmonary disease, unspecified: Secondary | ICD-10-CM | POA: Diagnosis present

## 2012-02-24 HISTORY — PX: OTHER SURGICAL HISTORY: SHX169

## 2012-02-24 HISTORY — DX: Other nonspecific abnormal finding of lung field: R91.8

## 2012-02-24 SURGERY — VIDEO ASSISTED THORACOSCOPY (VATS)/ LOBECTOMY
Anesthesia: General | Site: Chest | Laterality: Right | Wound class: Clean Contaminated

## 2012-02-24 MED ORDER — LIDOCAINE HCL (CARDIAC) 20 MG/ML IV SOLN
INTRAVENOUS | Status: DC | PRN
  Administered 2012-02-24: 80 mg via INTRAVENOUS

## 2012-02-24 MED ORDER — LACTATED RINGERS IV SOLN
INTRAVENOUS | Status: DC | PRN
  Administered 2012-02-24: 07:00:00 via INTRAVENOUS

## 2012-02-24 MED ORDER — OXYCODONE HCL 5 MG PO TABS: 5.0000 mg | ORAL_TABLET | ORAL | Status: AC | PRN

## 2012-02-24 MED ORDER — BUPIVACAINE 0.5 % ON-Q PUMP SINGLE CATH 400 ML
400.0000 mL | INJECTION | Status: DC
  Filled 2012-02-24: qty 400

## 2012-02-24 MED ORDER — ALBUTEROL SULFATE HFA 108 (90 BASE) MCG/ACT IN AERS
4.0000 | INHALATION_SPRAY | Freq: Four times a day (QID) | RESPIRATORY_TRACT | Status: DC
  Administered 2012-02-24: 4 via RESPIRATORY_TRACT
  Filled 2012-02-24: qty 6.7

## 2012-02-24 MED ORDER — SENNOSIDES-DOCUSATE SODIUM 8.6-50 MG PO TABS
1.0000 | ORAL_TABLET | Freq: Every evening | ORAL | Status: DC | PRN
  Administered 2012-02-27: 1 via ORAL
  Filled 2012-02-24 (×3): qty 1

## 2012-02-24 MED ORDER — ONDANSETRON HCL 4 MG/2ML IJ SOLN
INTRAMUSCULAR | Status: AC
  Filled 2012-02-24: qty 2

## 2012-02-24 MED ORDER — KCL IN DEXTROSE-NACL 20-5-0.45 MEQ/L-%-% IV SOLN
INTRAVENOUS | Status: AC
  Filled 2012-02-24: qty 1000

## 2012-02-24 MED ORDER — POTASSIUM CHLORIDE 10 MEQ/50ML IV SOLN
10.0000 meq | Freq: Every day | INTRAVENOUS | Status: DC | PRN
  Filled 2012-02-24: qty 50

## 2012-02-24 MED ORDER — HYDROMORPHONE HCL PF 1 MG/ML IJ SOLN
0.2500 mg | INTRAMUSCULAR | Status: DC | PRN
  Administered 2012-02-24 (×2): 0.5 mg via INTRAVENOUS

## 2012-02-24 MED ORDER — BISACODYL 5 MG PO TBEC
10.0000 mg | DELAYED_RELEASE_TABLET | Freq: Every day | ORAL | Status: DC
  Administered 2012-02-24 – 2012-03-02 (×6): 10 mg via ORAL
  Filled 2012-02-24 (×7): qty 2
  Filled 2012-02-24: qty 1
  Filled 2012-02-24: qty 2

## 2012-02-24 MED ORDER — FENTANYL CITRATE 0.05 MG/ML IJ SOLN
INTRAMUSCULAR | Status: DC | PRN
  Administered 2012-02-24: 100 ug via INTRAVENOUS
  Administered 2012-02-24 (×3): 50 ug via INTRAVENOUS

## 2012-02-24 MED ORDER — BUPIVACAINE ON-Q PAIN PUMP (FOR ORDER SET NO CHG)
INJECTION | Status: AC
  Filled 2012-02-24: qty 1

## 2012-02-24 MED ORDER — PROPOFOL 10 MG/ML IV EMUL
INTRAVENOUS | Status: DC | PRN
  Administered 2012-02-24: 150 mg via INTRAVENOUS

## 2012-02-24 MED ORDER — HYDROMORPHONE HCL PF 1 MG/ML IJ SOLN
INTRAMUSCULAR | Status: AC
  Filled 2012-02-24: qty 1

## 2012-02-24 MED ORDER — ONDANSETRON HCL 4 MG/2ML IJ SOLN: 4.0000 mg | Freq: Once | INTRAMUSCULAR | Status: DC | PRN

## 2012-02-24 MED ORDER — ACETAMINOPHEN 10 MG/ML IV SOLN
1000.0000 mg | Freq: Four times a day (QID) | INTRAVENOUS | Status: AC
  Administered 2012-02-24 – 2012-02-25 (×4): 1000 mg via INTRAVENOUS
  Filled 2012-02-24 (×5): qty 100

## 2012-02-24 MED ORDER — DIPHENHYDRAMINE HCL 12.5 MG/5ML PO ELIX
12.5000 mg | ORAL_SOLUTION | Freq: Four times a day (QID) | ORAL | Status: DC | PRN
  Filled 2012-02-24: qty 5

## 2012-02-24 MED ORDER — PHENYLEPHRINE HCL 10 MG/ML IJ SOLN
INTRAMUSCULAR | Status: DC | PRN
  Administered 2012-02-24 (×3): 40 ug via INTRAVENOUS

## 2012-02-24 MED ORDER — SIMVASTATIN 20 MG PO TABS
20.0000 mg | ORAL_TABLET | Freq: Every day | ORAL | Status: DC
  Administered 2012-02-24: 20 mg via ORAL
  Filled 2012-02-24 (×3): qty 1

## 2012-02-24 MED ORDER — ROCURONIUM BROMIDE 100 MG/10ML IV SOLN
INTRAVENOUS | Status: DC | PRN
  Administered 2012-02-24: 50 mg via INTRAVENOUS

## 2012-02-24 MED ORDER — OXYCODONE-ACETAMINOPHEN 5-325 MG PO TABS
1.0000 | ORAL_TABLET | ORAL | Status: DC | PRN
  Administered 2012-02-26 (×2): 2 via ORAL
  Filled 2012-02-24 (×4): qty 2

## 2012-02-24 MED ORDER — SODIUM CHLORIDE 0.9 % IJ SOLN: 9.0000 mL | INTRAMUSCULAR | Status: DC | PRN

## 2012-02-24 MED ORDER — HEMOSTATIC AGENTS (NO CHARGE) OPTIME
TOPICAL | Status: DC | PRN
  Administered 2012-02-24: 1 via TOPICAL

## 2012-02-24 MED ORDER — BUPIVACAINE HCL (PF) 0.5 % IJ SOLN
INTRAMUSCULAR | Status: DC | PRN
  Administered 2012-02-24: 5 mL

## 2012-02-24 MED ORDER — ONDANSETRON HCL 4 MG/2ML IJ SOLN
INTRAMUSCULAR | Status: DC | PRN
  Administered 2012-02-24: 4 mg via INTRAVENOUS

## 2012-02-24 MED ORDER — TRAMADOL HCL 50 MG PO TABS
50.0000 mg | ORAL_TABLET | Freq: Four times a day (QID) | ORAL | Status: DC | PRN
  Administered 2012-02-25 – 2012-03-03 (×5): 100 mg via ORAL
  Filled 2012-02-24 (×6): qty 2

## 2012-02-24 MED ORDER — ONDANSETRON HCL 4 MG/2ML IJ SOLN
4.0000 mg | Freq: Four times a day (QID) | INTRAMUSCULAR | Status: DC | PRN
  Administered 2012-02-24 – 2012-02-25 (×2): 4 mg via INTRAVENOUS
  Filled 2012-02-24 (×2): qty 2

## 2012-02-24 MED ORDER — VECURONIUM BROMIDE 10 MG IV SOLR
INTRAVENOUS | Status: DC | PRN
  Administered 2012-02-24: 1 mg via INTRAVENOUS

## 2012-02-24 MED ORDER — FENTANYL 10 MCG/ML IV SOLN
INTRAVENOUS | Status: DC
  Administered 2012-02-24: 13:00:00 via INTRAVENOUS
  Administered 2012-02-24: 50 ug via INTRAVENOUS
  Administered 2012-02-24: 30 ug via INTRAVENOUS
  Administered 2012-02-24: 110 ug via INTRAVENOUS
  Administered 2012-02-24: 40 ug via INTRAVENOUS
  Administered 2012-02-25: 50 ug via INTRAVENOUS
  Administered 2012-02-25: 17:00:00 via INTRAVENOUS
  Administered 2012-02-25: 210 ug via INTRAVENOUS
  Administered 2012-02-25 (×2): 230 ug via INTRAVENOUS
  Administered 2012-02-25: 80 ug via INTRAVENOUS
  Administered 2012-02-26: 270 ug via INTRAVENOUS
  Administered 2012-02-26: 340.9 ug via INTRAVENOUS
  Administered 2012-02-26: 90 ug via INTRAVENOUS
  Administered 2012-02-26: 19:00:00 via INTRAVENOUS
  Administered 2012-02-26: 50 ug via INTRAVENOUS
  Administered 2012-02-26: 150 ug via INTRAVENOUS
  Administered 2012-02-27: 100 ug via INTRAVENOUS
  Administered 2012-02-27: 20 ug via INTRAVENOUS
  Administered 2012-02-27: 150 ug via INTRAVENOUS
  Administered 2012-02-27: 100 ug via INTRAVENOUS
  Administered 2012-02-27: 40 ug via INTRAVENOUS
  Administered 2012-02-27: 120 ug via INTRAVENOUS
  Administered 2012-02-28: 40 ug via INTRAVENOUS
  Administered 2012-02-28: 70 ug via INTRAVENOUS
  Administered 2012-02-28: 40 ug via INTRAVENOUS
  Administered 2012-02-28: 110 ug via INTRAVENOUS
  Filled 2012-02-24 (×8): qty 50

## 2012-02-24 MED ORDER — ONDANSETRON HCL 4 MG/2ML IJ SOLN
4.0000 mg | Freq: Four times a day (QID) | INTRAMUSCULAR | Status: DC | PRN
  Administered 2012-02-29 – 2012-03-01 (×2): 4 mg via INTRAVENOUS
  Filled 2012-02-24 (×2): qty 2

## 2012-02-24 MED ORDER — DIPHENHYDRAMINE HCL 50 MG/ML IJ SOLN
12.5000 mg | Freq: Four times a day (QID) | INTRAMUSCULAR | Status: DC | PRN
  Filled 2012-02-24: qty 0.25

## 2012-02-24 MED ORDER — 0.9 % SODIUM CHLORIDE (POUR BTL) OPTIME
TOPICAL | Status: DC | PRN
  Administered 2012-02-24: 2000 mL

## 2012-02-24 MED ORDER — NALOXONE HCL 0.4 MG/ML IJ SOLN
0.4000 mg | INTRAMUSCULAR | Status: DC | PRN
  Filled 2012-02-24: qty 1

## 2012-02-24 MED ORDER — DEXTROSE 5 % IV SOLN
1.5000 g | Freq: Two times a day (BID) | INTRAVENOUS | Status: AC
  Administered 2012-02-24 – 2012-02-25 (×2): 1.5 g via INTRAVENOUS
  Filled 2012-02-24 (×2): qty 1.5

## 2012-02-24 MED ORDER — KCL IN DEXTROSE-NACL 20-5-0.45 MEQ/L-%-% IV SOLN
INTRAVENOUS | Status: DC
  Administered 2012-02-24 – 2012-02-26 (×3): via INTRAVENOUS
  Filled 2012-02-24 (×10): qty 1000

## 2012-02-24 MED ORDER — SODIUM CHLORIDE 0.9 % IV SOLN
10.0000 mg | INTRAVENOUS | Status: DC | PRN
  Administered 2012-02-24: 20 ug/min via INTRAVENOUS

## 2012-02-24 SURGICAL SUPPLY — 76 items
APPLIER CLIP LOGIC TI 5 (MISCELLANEOUS) ×2 IMPLANT
APPLIER CLIP ROT 10 11.4 M/L (STAPLE)
CANISTER SUCTION 2500CC (MISCELLANEOUS) ×2 IMPLANT
CATH KIT ON Q 5IN SLV (PAIN MANAGEMENT) ×2 IMPLANT
CATH THORACIC 28FR (CATHETERS) IMPLANT
CATH THORACIC 28FR RT ANG (CATHETERS) IMPLANT
CATH THORACIC 36FR (CATHETERS) IMPLANT
CATH THORACIC 36FR RT ANG (CATHETERS) IMPLANT
CLIP APPLIE ROT 10 11.4 M/L (STAPLE) IMPLANT
CLIP TI MEDIUM 6 (CLIP) ×2 IMPLANT
CLOTH BEACON ORANGE TIMEOUT ST (SAFETY) ×2 IMPLANT
CONN Y 3/8X3/8X3/8  BEN (MISCELLANEOUS) ×1
CONN Y 3/8X3/8X3/8 BEN (MISCELLANEOUS) ×1 IMPLANT
CONT SPEC 4OZ CLIKSEAL STRL BL (MISCELLANEOUS) ×4 IMPLANT
DRAPE LAPAROSCOPIC ABDOMINAL (DRAPES) ×2 IMPLANT
DRAPE SLUSH MACHINE 52X66 (DRAPES) IMPLANT
DRAPE SLUSH/WARMER DISC (DRAPES) IMPLANT
DRAPE WARM FLUID 44X44 (DRAPE) ×2 IMPLANT
DRSG ADAPTIC 3X8 NADH LF (GAUZE/BANDAGES/DRESSINGS) ×2 IMPLANT
ELECT REM PT RETURN 9FT ADLT (ELECTROSURGICAL) ×2
ELECTRODE REM PT RTRN 9FT ADLT (ELECTROSURGICAL) ×1 IMPLANT
GLOVE BIO SURGEON STRL SZ 6 (GLOVE) ×2 IMPLANT
GLOVE BIO SURGEON STRL SZ 6.5 (GLOVE) ×2 IMPLANT
GLOVE BIOGEL PI IND STRL 6.5 (GLOVE) ×2 IMPLANT
GLOVE BIOGEL PI IND STRL 7.0 (GLOVE) ×2 IMPLANT
GLOVE BIOGEL PI INDICATOR 6.5 (GLOVE) ×2
GLOVE BIOGEL PI INDICATOR 7.0 (GLOVE) ×2
GLOVE EUDERMIC 7 POWDERFREE (GLOVE) ×4 IMPLANT
GOWN PREVENTION PLUS XLARGE (GOWN DISPOSABLE) ×2 IMPLANT
GOWN STRL NON-REIN LRG LVL3 (GOWN DISPOSABLE) ×6 IMPLANT
HANDLE STAPLE ENDO GIA SHORT (STAPLE) ×1
HEMOSTAT SURGICEL 2X14 (HEMOSTASIS) ×2 IMPLANT
KIT BASIN OR (CUSTOM PROCEDURE TRAY) ×2 IMPLANT
KIT ROOM TURNOVER OR (KITS) ×2 IMPLANT
KIT SUCTION CATH 14FR (SUCTIONS) IMPLANT
NS IRRIG 1000ML POUR BTL (IV SOLUTION) ×4 IMPLANT
PACK CHEST (CUSTOM PROCEDURE TRAY) ×2 IMPLANT
PAD ARMBOARD 7.5X6 YLW CONV (MISCELLANEOUS) ×4 IMPLANT
POUCH ENDO CATCH II 15MM (MISCELLANEOUS) ×2 IMPLANT
RELOAD EGIA 45 MED/THCK PURPLE (STAPLE) ×2 IMPLANT
RELOAD EGIA 45 TAN VASC (STAPLE) ×2 IMPLANT
RELOAD EGIA 60 MED/THCK PURPLE (STAPLE) ×8 IMPLANT
RELOAD EGIA TRIS TAN 45 CVD (STAPLE) ×4 IMPLANT
SEALANT PROGEL (MISCELLANEOUS) ×2 IMPLANT
SEALANT SURG COSEAL 4ML (VASCULAR PRODUCTS) IMPLANT
SEALANT SURG COSEAL 8ML (VASCULAR PRODUCTS) IMPLANT
SOLUTION ANTI FOG 6CC (MISCELLANEOUS) ×2 IMPLANT
SPECIMEN JAR MEDIUM (MISCELLANEOUS) ×2 IMPLANT
SPONGE GAUZE 4X4 12PLY (GAUZE/BANDAGES/DRESSINGS) ×2 IMPLANT
SPONGE INTESTINAL PEANUT (DISPOSABLE) IMPLANT
STAPLER ENDO GIA 12MM SHORT (STAPLE) ×1 IMPLANT
SUT PROLENE 4 0 RB 1 (SUTURE)
SUT PROLENE 4-0 RB1 .5 CRCL 36 (SUTURE) IMPLANT
SUT SILK  1 MH (SUTURE) ×2
SUT SILK 1 MH (SUTURE) ×2 IMPLANT
SUT SILK 2 0SH CR/8 30 (SUTURE) ×2 IMPLANT
SUT SILK 3 0SH CR/8 30 (SUTURE) ×2 IMPLANT
SUT VIC AB 1 CTX 36 (SUTURE) ×1
SUT VIC AB 1 CTX36XBRD ANBCTR (SUTURE) ×1 IMPLANT
SUT VIC AB 2-0 CTX 36 (SUTURE) ×2 IMPLANT
SUT VIC AB 2-0 UR6 27 (SUTURE) ×2 IMPLANT
SUT VIC AB 3-0 MH 27 (SUTURE) ×2 IMPLANT
SUT VIC AB 3-0 X1 27 (SUTURE) ×4 IMPLANT
SUT VICRYL 2 TP 1 (SUTURE) ×2 IMPLANT
SWAB COLLECTION DEVICE MRSA (MISCELLANEOUS) IMPLANT
SYSTEM SAHARA CHEST DRAIN ATS (WOUND CARE) ×2 IMPLANT
TAPE CLOTH 4X10 WHT NS (GAUZE/BANDAGES/DRESSINGS) ×2 IMPLANT
TAPE CLOTH SURG 4X10 WHT LF (GAUZE/BANDAGES/DRESSINGS) ×2 IMPLANT
TIP APPLICATOR SPRAY EXTEND 16 (VASCULAR PRODUCTS) ×2 IMPLANT
TOWEL OR 17X24 6PK STRL BLUE (TOWEL DISPOSABLE) ×2 IMPLANT
TOWEL OR 17X26 10 PK STRL BLUE (TOWEL DISPOSABLE) ×2 IMPLANT
TRAP SPECIMEN MUCOUS 40CC (MISCELLANEOUS) IMPLANT
TRAY FOLEY CATH 14FRSI W/METER (CATHETERS) ×2 IMPLANT
TUBE ANAEROBIC SPECIMEN COL (MISCELLANEOUS) IMPLANT
TUNNELER SHEATH ON-Q 11GX8 (MISCELLANEOUS) ×2 IMPLANT
WATER STERILE IRR 1000ML POUR (IV SOLUTION) ×4 IMPLANT

## 2012-02-24 NOTE — Anesthesia Preprocedure Evaluation (Addendum)
Anesthesia Evaluation  Patient identified by MRN, date of birth, ID band Patient awake, Patient confused and Patient unresponsive    Reviewed: Allergy & Precautions, H&P , NPO status   Airway Mallampati: II TM Distance: >3 FB Neck ROM: Full    Dental  (+) Upper Dentures and Lower Dentures   Pulmonary COPDCurrent Smoker,          Cardiovascular + dysrhythmias Rhythm:regular Rate:Normal     Neuro/Psych    GI/Hepatic   Endo/Other    Renal/GU      Musculoskeletal   Abdominal   Peds  Hematology   Anesthesia Other Findings   Reproductive/Obstetrics                         Anesthesia Physical Anesthesia Plan  ASA: II  Anesthesia Plan: General   Post-op Pain Management:    Induction: Intravenous  Airway Management Planned: Double Lumen EBT  Additional Equipment: Arterial line and CVP  Intra-op Plan:   Post-operative Plan: Possible Post-op intubation/ventilation  Informed Consent: I have reviewed the patients History and Physical, chart, labs and discussed the procedure including the risks, benefits and alternatives for the proposed anesthesia with the patient or authorized representative who has indicated his/her understanding and acceptance.   Dental advisory given  Plan Discussed with: CRNA, Anesthesiologist and Surgeon  Anesthesia Plan Comments:        Anesthesia Quick Evaluation

## 2012-02-24 NOTE — H&P (Signed)
PCP is Fredirick Maudlin, MD, MD Referring Provider is Fredirick Maudlin, MD    Chief Complaint   Patient presents with   .  Lung Cancer       MTOC/ Eval & TX        HPI: 71 year old woman with a 50-pack-year history of smoking since chief complaint of the lung cancer.   Alicia Huynh is a 71 year old woman with a history of tobacco abuse. She recently was having some left-sided chest pain. Because of family concerns with a history of coronary disease she went to see Juanetta Gosling and [facility]. They found no evidence of coronary disease, but a chest x-ray did show a right upper lobe nodule. This led to a CT of the chest which showed 1.7 x 1.2 cm mass in the right upper lobe. There is no hilar or mediastinal adenopathy. A PET CT then was done which showed the lesion to be hypermetabolic with an SUV max of 13.1. 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   She states that she's been feeling well. Her weight has been stable. She has an occasional cough. She denies hemoptysis, shortness of breath, wheezing, loss of appetite.    Past Medical History   Diagnosis  Date   .  Hyperlipidemia     .  COPD (chronic obstructive pulmonary disease)     .  Chest pain     .  Left bundle branch block     .  Tobacco abuse         50 pack years; one pack per day         Past Surgical History   Procedure  Date   .  None           Family History   Problem  Relation  Age of Onset   .  Lymphoma  Mother         58   .  Heart attack  Father         19   .  Heart attack  Brother         40 -Bypass X3. another Bypass at [facility] failed   .  Heart defect  Brother         heart surgery   .  Heart attack  Daughter         stents X3        Social History History   Substance Use Topics   .  Smoking status:  Former Smoker -- 1.0 packs/day for 50 years       Types:  Cigarettes       Quit date:  02/03/2012   .  Smokeless tobacco:  Never Used   .  Alcohol Use:  No           Current Outpatient Prescriptions   Medication  Sig  Dispense  Refill   .  aspirin 81 MG tablet  Take 81 mg by mouth daily.         .  cyclobenzaprine (FLEXERIL) 10 MG tablet  Take 10 mg by mouth 2 (two) times daily as needed. For muscle pain         .  fluticasone (CUTIVATE) 0.05 % cream  Apply topically as needed.         .  pravastatin (PRAVACHOL) 40 MG tablet  Take 40 mg by mouth daily.         .  traMADol (ULTRAM) 50 MG tablet  Take  50 mg by mouth every 6 (six) hours as needed. For pain               Allergies   Allergen  Reactions   .  Codeine  Shortness Of Breath        Review of Systems  Constitutional: Negative for fever, chills and unexpected weight change.  Respiratory: Positive for cough. Negative for chest tightness, shortness of breath and wheezing.   Cardiovascular: Positive for chest pain (left sided, constant, now resolved). Negative for leg swelling.  Gastrointestinal: Negative.   Genitourinary: Negative.   Musculoskeletal: Negative.   Neurological: Negative.   Hematological: Negative.   All other systems reviewed and are negative.     BP 150/83  Pulse 71  Temp(Src) 97.1 F (36.2 C) (Oral)  Resp 18  Ht 5\' 6"  (1.676 m)  Wt 135 lb (61.236 kg)  BMI 21.79 kg/m2  SpO2 98% Physical Exam  Vitals reviewed. Constitutional: She is oriented to person, place, and time. She appears well-developed and well-nourished. No distress.  HENT:   Head: Normocephalic and atraumatic.  Eyes: EOM are normal. Pupils are equal, round, and reactive to light.  Neck: Normal range of motion. Neck supple. No thyromegaly present.  Cardiovascular: Normal rate, regular rhythm and intact distal pulses.  Exam reveals no gallop and no friction rub.    No murmur heard. Pulmonary/Chest: Effort normal and breath sounds normal. She has no wheezes. She has no rales.  Abdominal: Soft. There is no tenderness.  Musculoskeletal: She exhibits no edema.  Lymphadenopathy:    She has no  cervical adenopathy.  Neurological: She is oriented to person, place, and time. No cranial nerve deficit.  Skin: Skin is warm and dry.        Diagnostic Tests: CT Chest 01/18/12 IMPRESSION:   1. No acute cardiopulmonary abnormalities.   2. Pulmonary nodule in the right upper lobe is suspicious for   primary pulmonary neoplasm. Recommend further evaluation with PET-   CT.     PET/CT 02/06/12   IMPRESSION:   Hypermetabolic right upper lobe nodule is most consistent with   primary bronchogenic carcinoma. Imaging findings are indicative of   stage IA disease.     Lung, biopsy, Right lung 02/07/12 - SQUAMOUS CELL CARCINOMA. - SEE COMMENT.   PFTs- FEV1 2.35(98% predicted)   Impression: 71 year old smoker with a new right upper lobe mass. 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 She is otherwise healthy and very active. She has essentially normal pulmonary function testing. She is a good operative candidate.   I discussed with the patient and her family indications, risks, benefits, and alternatives (radiation therapy) for treatment of the lung cancer. They understand the surgery give the best chance for cure. I discussed with them the nature of the surgery including the need for general anesthesia, incisions to be used, preference for a minimally invasive approach, expected hospital stay, and overall recovery. They understand the risk of surgery include but are not limited to death, MI, DVT, PE, bleeding, possible need for transfusion, infection, prolonged air leak, as well as other unforeseeable complications. She understands and accepts these risks and wishes to proceed with right vats and right upper lobectomy. She does understand that the final staging will depend on her surgical pathology an additional treatment might be necessary if there is lymph node involvement.   Plan: Right VATS, right upper lobectomy Thursday, June 6. She  will be admitted the day of surgery.  02/24/12 No interval change

## 2012-02-24 NOTE — Preoperative (Signed)
Beta Blockers   Reason not to administer Beta Blockers:Not Applicable 

## 2012-02-24 NOTE — Interval H&P Note (Signed)
History and Physical Interval Note:  02/24/2012 7:18 AM  Alicia Huynh  has presented today for surgery, with the diagnosis of (R) LUNG MASS  The various methods of treatment have been discussed with the patient and family. After consideration of risks, benefits and other options for treatment, the patient has consented to  Procedure(s) (LRB): VIDEO ASSISTED THORACOSCOPY (VATS)/ LOBECTOMY (Right) as a surgical intervention .  The patients' history has been reviewed, patient examined, no change in status, stable for surgery.  I have reviewed the patients' chart and labs.  Questions were answered to the patient's satisfaction.     Kambre Messner C

## 2012-02-24 NOTE — Transfer of Care (Signed)
Immediate Anesthesia Transfer of Care Note  Patient: Alicia Huynh  Procedure(s) Performed: Procedure(s) (LRB): VIDEO ASSISTED THORACOSCOPY (VATS)/ LOBECTOMY (Right)  Patient Location: PACU  Anesthesia Type: General  Level of Consciousness: awake, alert , oriented and patient cooperative  Airway & Oxygen Therapy: Patient Spontanous Breathing and Patient connected to nasal cannula oxygen  Post-op Assessment: Report given to PACU RN, Post -op Vital signs reviewed and stable and Patient moving all extremities X 4  Post vital signs: Reviewed and stable  Complications: No apparent anesthesia complications

## 2012-02-24 NOTE — Anesthesia Procedure Notes (Signed)
Procedure Name: Intubation Date/Time: 02/24/2012 8:29 AM Performed by: Rogelia Boga Pre-anesthesia Checklist: Patient identified, Emergency Drugs available, Suction available, Patient being monitored and Timeout performed Patient Re-evaluated:Patient Re-evaluated prior to inductionOxygen Delivery Method: Circle system utilized Preoxygenation: Pre-oxygenation with 100% oxygen Intubation Type: IV induction Ventilation: Mask ventilation without difficulty Laryngoscope Size: Mac and 3 Grade View: Grade I Tube type: Oral Endobronchial tube: Double lumen EBT and 35 Fr Number of attempts: 1 Airway Equipment and Method: Stylet Placement Confirmation: ETT inserted through vocal cords under direct vision,  positive ETCO2 and breath sounds checked- equal and bilateral Tube secured with: Tape Dental Injury: Teeth and Oropharynx as per pre-operative assessment

## 2012-02-24 NOTE — Progress Notes (Signed)
Utilization review completed.  

## 2012-02-24 NOTE — Progress Notes (Signed)
Report received from Richardo Priest RN.

## 2012-02-24 NOTE — Brief Op Note (Signed)
02/24/2012  11:50 AM  PATIENT:  Alicia Huynh  71 y.o. female  PRE-OPERATIVE DIAGNOSIS:  Right upper lobe lung nodule  POST-OPERATIVE DIAGNOSIS:  Squamous cell carcinoma, right upper lobe  PROCEDURE:  Procedure(s): RIGHT VIDEO ASSISTED THORACOSCOPY (VATS)/MINI-THORACOTOMY, RIGHT UPPER LOBECTOMY, LYMPH NODE DISSECTION  SURGEON:  Surgeon(s): Loreli Slot, MD  ASSISTANT: Coral Ceo, PA-C  ANESTHESIA:   general  SPECIMEN:  Source of Specimen:  Right upper lobe, lymph nodes  DISPOSITION OF SPECIMEN:  Pathology  DRAINS: 2  Right Chest tubes  PATIENT CONDITION:  PACU - hemodynamically stable.

## 2012-02-24 NOTE — Anesthesia Postprocedure Evaluation (Signed)
Anesthesia Post Note  Patient: Alicia Huynh  Procedure(s) Performed: Procedure(s) (LRB): VIDEO ASSISTED THORACOSCOPY (VATS)/ LOBECTOMY (Right)  Anesthesia type: General  Patient location: PACU  Post pain: Pain level controlled and Adequate analgesia  Post assessment: Post-op Vital signs reviewed, Patient's Cardiovascular Status Stable, Respiratory Function Stable, Patent Airway and Pain level controlled  Last Vitals:  Filed Vitals:   02/24/12 1242  BP:   Pulse:   Temp:   Resp: 18    Post vital signs: Reviewed and stable  Level of consciousness: awake, alert  and oriented  Complications: No apparent anesthesia complications

## 2012-02-25 ENCOUNTER — Inpatient Hospital Stay (HOSPITAL_COMMUNITY): Payer: Medicare FFS

## 2012-02-25 LAB — CBC
HCT: 36.6 % (ref 36.0–46.0)
Hemoglobin: 12.7 g/dL (ref 12.0–15.0)
MCH: 32.2 pg (ref 26.0–34.0)
MCV: 92.7 fL (ref 78.0–100.0)
RBC: 3.95 MIL/uL (ref 3.87–5.11)
WBC: 9.5 10*3/uL (ref 4.0–10.5)

## 2012-02-25 LAB — BLOOD GAS, ARTERIAL
Acid-Base Excess: 1.9 mmol/L (ref 0.0–2.0)
TCO2: 27.5 mmol/L (ref 0–100)
pCO2 arterial: 42.7 mmHg (ref 35.0–45.0)
pH, Arterial: 7.405 — ABNORMAL HIGH (ref 7.350–7.400)
pO2, Arterial: 114 mmHg — ABNORMAL HIGH (ref 80.0–100.0)

## 2012-02-25 LAB — BASIC METABOLIC PANEL
CO2: 25 mEq/L (ref 19–32)
Calcium: 8.6 mg/dL (ref 8.4–10.5)
GFR calc non Af Amer: 86 mL/min — ABNORMAL LOW (ref >90)
Glucose, Bld: 134 mg/dL — ABNORMAL HIGH (ref 70–99)
Potassium: 4 mEq/L (ref 3.5–5.1)
Sodium: 137 mEq/L (ref 135–145)

## 2012-02-25 LAB — CARDIAC PANEL(CRET KIN+CKTOT+MB+TROPI)
CK, MB: 3.4 ng/mL (ref 0.3–4.0)
Relative Index: 1.3 (ref 0.0–2.5)
Total CK: 255 U/L — ABNORMAL HIGH (ref 7–177)
Troponin I: <0.3 ng/mL (ref <0.30)

## 2012-02-25 MED ORDER — NITROGLYCERIN 0.4 MG SL SUBL
0.4000 mg | SUBLINGUAL_TABLET | SUBLINGUAL | Status: DC | PRN
  Administered 2012-02-25: 0.4 mg via SUBLINGUAL

## 2012-02-25 MED ORDER — NITROGLYCERIN 0.4 MG SL SUBL: 0.4000 mg | SUBLINGUAL_TABLET | SUBLINGUAL | Status: DC | PRN

## 2012-02-25 MED ORDER — PROMETHAZINE HCL 25 MG/ML IJ SOLN
12.5000 mg | INTRAMUSCULAR | Status: DC | PRN
  Administered 2012-02-25: 12.5 mg via INTRAVENOUS
  Filled 2012-02-25 (×2): qty 1

## 2012-02-25 MED ORDER — ENOXAPARIN SODIUM 40 MG/0.4ML ~~LOC~~ SOLN
40.0000 mg | SUBCUTANEOUS | Status: DC
  Administered 2012-02-25 – 2012-02-26 (×2): 40 mg via SUBCUTANEOUS
  Filled 2012-02-25 (×3): qty 0.4

## 2012-02-25 MED ORDER — ALBUTEROL SULFATE HFA 108 (90 BASE) MCG/ACT IN AERS
4.0000 | INHALATION_SPRAY | Freq: Four times a day (QID) | RESPIRATORY_TRACT | Status: DC | PRN
  Filled 2012-02-25: qty 6.7

## 2012-02-25 MED ORDER — NITROGLYCERIN 0.4 MG SL SUBL
SUBLINGUAL_TABLET | SUBLINGUAL | Status: AC
  Administered 2012-02-25: 19:00:00
  Filled 2012-02-25: qty 25

## 2012-02-25 NOTE — Progress Notes (Addendum)
1 Day Post-Op Procedure(s) (LRB): VIDEO ASSISTED THORACOSCOPY (VATS)/ LOBECTOMY (Right) Subjective: Some incisional pain, no nausea Walked the hallway this AM  Objective: Vital signs in last 24 hours: Temp:  [97.2 F (36.2 C)-98.8 F (37.1 C)] 98.5 F (36.9 C) (06/07 0725) Pulse Rate:  [56-82] 82  (06/07 0700) Cardiac Rhythm:  [-] Normal sinus rhythm (06/07 0300) Resp:  [8-19] 17  (06/07 0700) BP: (109-151)/(41-103) 151/68 mmHg (06/06 2300) SpO2:  [96 %-100 %] 96 % (06/07 0700) Arterial Line BP: (102-185)/(39-95) 185/77 mmHg (06/06 2300) Weight:  [128 lb 8.5 oz (58.3 kg)] 128 lb 8.5 oz (58.3 kg) (06/06 1500)  Hemodynamic parameters for last 24 hours:    Intake/Output from previous day: 06/06 0701 - 06/07 0700 In: 2188 [I.V.:1986; IV Piggyback:202] Out: 1210 [Urine:840; Blood:150; Chest Tube:220] Intake/Output this shift: Total I/O In: -  Out: 200 [Urine:200]  General appearance: alert and no distress Heart: regular rate and rhythm Lungs: diminished breath sounds bilaterally Abdomen: normal findings: soft, non-tender  Lab Results:  Basename 02/25/12 0400 02/22/12 1344  WBC 9.5 8.3  HGB 12.7 14.3  HCT 36.6 40.9  PLT 155 194   BMET:  Basename 02/25/12 0400 02/22/12 1344  NA 137 142  K 4.0 4.1  CL 104 108  CO2 25 20  GLUCOSE 134* 89  BUN 12 15  CREATININE 0.67 0.72  CALCIUM 8.6 9.6    PT/INR:  Basename 02/22/12 1344  LABPROT 14.0  INR 1.06   ABG    Component Value Date/Time   PHART 7.405* 02/25/2012 0401   HCO3 26.2* 02/25/2012 0401   TCO2 27.5 02/25/2012 0401   ACIDBASEDEF 1.1 02/22/2012 1344   O2SAT 99.2 02/25/2012 0401   CBG (last 3)  No results found for this basename: GLUCAP:3 in the last 72 hours  Assessment/Plan: S/P Procedure(s) (LRB): VIDEO ASSISTED THORACOSCOPY (VATS)/ LOBECTOMY (Right) POD # 1 VATS, RUL Looks great Small air leak, minimal drainage, keep both CT today SCD and lovenox for DVT prophylaxis Continue ambulation Atelectasis- IS,  deep breathing Advance diet D/c foley and A line   LOS: 1 day    Alicia Huynh C 02/25/2012

## 2012-02-25 NOTE — Clinical Documentation Improvement (Signed)
Abnormal Labs Clarification  THIS DOCUMENT IS NOT A PERMANENT PART OF THE MEDICAL RECORD  TO RESPOND TO THE THIS QUERY, FOLLOW THE INSTRUCTIONS BELOW:  1. If needed, update documentation for the patient's encounter via the notes activity.  2. Access this query again and click edit on the [employer].  3. After updating, or not, click F2 to complete all highlighted (required) fields concerning your review. Select "additional documentation in the medical record" OR "no additional documentation provided".  4. Click Sign note button.  5. The deficiency will fall out of your InBasket *Please let us know if you are not able to complete this workflow by phone or e-mail (listed below).  Please update your documentation within the medical record to reflect your response to this query.                                                                                   02/25/12  Dear Dr. Kathie Rhodes. Dorris Fetch /Associates  In a better effort to capture your patient's severity of illness, reflect appropriate length of stay and utilization of resources, a review of the medical record has revealed the following indicators.    Based on your clinical judgment, please clarify and document in a progress note and/or discharge summary the clinical condition associated with the following supporting information:  In responding to this query please exercise your independent judgment.  The fact that a query is asked, does not imply that any particular answer is desired or expected.  Abnormal findings (laboratory, x-ray, pathologic, and other diagnostic results) are not coded and reported unless the physician indicates their clinical significance.   The medical record reflects the following clinical findings, please clarify the diagnostic and/or clinical significance:      Noted in chest x-ray's on 6/6 and 6/7 "Cardiomegaly. Central pulmonary vascular prominence.  left base atelectasis. " Please clarify if this is an  appropriate secondary diagnosis.     Possible Clinical Conditions?                                  Atelectasis  Other Condition  Cannot Clinically Determine   Treatment: Proventil 4 puffs,  q 6hrs    Monitoring:  Continuous pulse ox   Diagnostic: chest xray ordered daily x 2   Reviewed: additional documentation in the medical record   Thank You,  Leonette Most Addison  Clinical Documentation Specialist RN, BSN:  Pager [phone]  HIM off [phone]  Health Information Management Story

## 2012-02-25 NOTE — Op Note (Signed)
Alicia Huynh, Alicia Huynh                 ACCOUNT NO.:  [id number]  MEDICAL RECORD NO.:  [id number]  LOCATION:  3315                         FACILITY:  [facility]  PHYSICIAN:  Salvatore Decent. Dorris Fetch, M.D.DATE OF BIRTH:  1941/08/02  DATE OF PROCEDURE:  02/24/2012 DATE OF DISCHARGE:                              OPERATIVE REPORT   PREOPERATIVE DIAGNOSIS:  Squamous cell carcinoma of right upper lobe.  POSTOPERATIVE DIAGNOSIS:  Squamous cell carcinoma of right upper lobe.  PROCEDURE:  Right video-assisted thoracoscopy, right upper lobectomy, mediastinal lymph node dissection, placement of On-Q local anesthetic catheter.  SURGEON:  Salvatore Decent. Dorris Fetch, MD  ASSISTANT:  Coral Ceo, PA  ANESTHESIA:  General.  FINDINGS:  Right upper lobe nodule 2.5 cm from bronchial margin. Multiple enlarged lymph nodes were sent as separate specimens for permanent pathology.  CLINICAL NOTE:  Alicia Huynh is a 71 year old woman with a history of smoking who recently had a chest x-ray, which revealed a right upper lobe nodule on CT scan, this was a 1.7 x 1.2 cm.  A PET-CT showed the lesion to be hypermetabolic with no evidence of regional or distant metastases.  The needle biopsy showed this to be squamous cell carcinoma, and the patient was referred for surgical consideration.  She was advised to undergo a VATS lobectomy.  The indications, risks, benefits, and alternatives were discussed in detail with the patient. She understood and accepted the risks and agreed to proceed.  OPERATIVE NOTE:  Alicia Huynh was brought to the preoperative holding area on February 24, 2012, there, the Anesthesia Service placed a central line and arterial blood pressure monitoring line.  PAS hose were placed for DVT prophylaxis.  Intravenous antibiotics were administered.  She was taken to the operating room, anesthetized, and intubated with a double-lumen endotracheal tube.  A Foley catheter was placed.  She was placed in the left  lateral decubitus position, and the right chest was prepped and draped in usual sterile fashion.  Single lung ventilation of the left lung was initiated and was tolerated well throughout the procedure.  There was good separation with no gross ventilation of the right lung.  An incision was made in the midaxillary line at approximately the seventh intercostal space and was carried through the skin and subcutaneous tissue.  The chest was entered bluntly using a hemostat.  A port was inserted through the incision and a thoracoscope was placed through the port.  There was no pleural effusion and no abnormality noted of the visceral or parietal pleura.  A small port incision was made below the tip of the scapula for instrumentation and then a small utility incision was made anterolaterally, this was approximately 6 cm in length.  It was carried through the skin and subcutaneous tissue.  The muscle fibers were separated.  The intercostal muscles were divided.  No rib spreading was done throughout the procedure.  The lung was grasped and the pleural reflection was divided anteriorly at the hilum exposing the pulmonary veins and pulmonary artery.  Of note, multiple level 10 nodes were encountered and were taken as separate specimens and sent for permanent pathology.  It was noted that the major fissure was nearly  complete; however, the minor fissure was mostly incomplete.  Next, the inferior pulmonary ligament was divided up to the level of the inferior pulmonary vein.  No significant lymph nodes were encountered with division of the inferior pulmonary ligament, nor were there any obvious paraesophageal lymph nodes in that area.  Next, the middle lobe branch of the superior pulmonary vein was identified.  The upper lobe branch then was dissected out and divided with an Endo-GIA stapler.  Attention then was turned to the pulmonary artery and the upper lobe branches were carefully dissected out  and divided with an Endo-GIA stapler.  The posterior descending branch was identified during this dissection, but was not divided at this time, but was subsequently divided later in the procedure.  Next, the minor fissure was completed with sequential firings of an Endo-GIA stapler. This then allowed dissection around the posterior ascending branch of the pulmonary artery, which was likewise divided with a stapler.  At this point, large level 11 nodes were noted around the bronchus, majority of these were taken along with the bronchus, but some were sent as separate specimens.  At this point, the posterior aspect of the pleural reflection was incised at the hilum.  This allowed completion of the major fissure with a final firing of an Endo-GIA stapler.  The right upper lobe bronchus then was clearly visible.  The stapler was placed across the bronchus and closed.  The test inflation showed good aeration of the lower and middle lobes.  The stapler then was fired.  The specimen was placed in the endoscopic retrieval bag and removed through the anterior incision.  Next, the azygous vein was elevated and the level 4R lymph nodes were dissected out.  There was moderate bleeding with this dissection and finally the subcarinal level 7 nodes were dissected out.  Again, these nodes were relatively friable and bled easily. None of the mediastinal nodes were grossly involved with tumor.  Surgicel was applied to both areas of the nodal dissection.  The bronchial stump was inspected with no evidence of an air leak.  The middle lobe then was tacked to the lower lobe to prevent torsion, this was done with two 3-0 Vicryl sutures.  Separate incision was made posteriorly, and an On-Q local anesthetic catheter was tunneled in a subpleural location and secured with a 3-0 silk at its exit site, and an additional port incision was made next to the original incision.  A 36- French right-angle chest tube was  placed and directed posteriorly and a 28-French chest tube was placed anteriorly and directed superiorly to the apex.  Both chest tubes were secured with #1 silk sutures.  The lung was reinflated.  The scope was removed.  The posterior port incision was closed with a #1 Vicryl fascial suture and a 3-0 Vicryl subcuticular suture.  The anterior incision was closed with a #1 Vicryl fascial closure followed by 2-0 Vicryl subcutaneous suture and a 3-0 Vicryl subcuticular suture.  All sponge, needle, and instrument counts were correct at the end of the procedure.  The patient was extubated in the operating room and was taken to the postanesthetic care unit in good condition.     Salvatore Decent Dorris Fetch, M.D.     SCH/MEDQ  D:  02/24/2012  T:  02/25/2012  Job:  [id number]

## 2012-02-25 NOTE — Progress Notes (Signed)
Patient ID: Alicia Huynh, female   DOB: 1941-07-16, 71 y.o.   MRN: [id number] CTSP for CP She describes this as a sharp , burning pain on the right anterior chest and some pain in right arm as well.  Of note her fentanyl PCA had run out and has just now been resumed Nausea and vomitting x 2 earlier this PM  BP 148/75  Pulse 75  Temp(Src) 98 F (36.7 C) (Oral)  Resp 16  Ht 5\' 6"  (1.676 m)  Wt 128 lb 8.5 oz (58.3 kg)  BMI 20.74 kg/m2  SpO2 94%  Lungs clear anteriorly + tenderness to palpation over right anterior chest Cardiac RRR  ECG - LBBB no significant change from preop  IMP- likely incisional pain. Doubt this is cardiac in nature, but she does have a left bundle and a strong family history. Will cycle enzymes just to be safe.

## 2012-02-25 NOTE — Progress Notes (Signed)
Pt c/o sharp burning chest pain radiating in arm. bp 165/90, pt vomitted x2  Approx.  100cc green bile. Paged dr Dorris Fetch, 12 lead ekg obtained, one ntg SL given. And 2l/min of oxygen applied. md at bedside assessing pt. Beryle Quant

## 2012-02-25 NOTE — Anesthesia Postprocedure Evaluation (Signed)
  Anesthesia Post-op Note  Patient: Alicia Huynh  Procedure(s) Performed: Procedure(s) (LRB): VIDEO ASSISTED THORACOSCOPY (VATS)/ LOBECTOMY (Right)  Patient Location: PACU  Anesthesia Type: General  Level of Consciousness: awake, alert , oriented and patient cooperative  Airway and Oxygen Therapy: Patient Spontanous Breathing and Patient connected to nasal cannula oxygen  Post-op Pain: moderate  Post-op Assessment: Post-op Vital signs reviewed, Patient's Cardiovascular Status Stable, Respiratory Function Stable, Patent Airway, No signs of Nausea or vomiting and Pain level controlled  Post-op Vital Signs: stable  Complications: No apparent anesthesia complications

## 2012-02-26 ENCOUNTER — Inpatient Hospital Stay (HOSPITAL_COMMUNITY): Payer: Medicare FFS

## 2012-02-26 DIAGNOSIS — I214 Non-ST elevation (NSTEMI) myocardial infarction: Secondary | ICD-10-CM

## 2012-02-26 LAB — CBC
HCT: 36.8 % (ref 36.0–46.0)
Hemoglobin: 12.6 g/dL (ref 12.0–15.0)
MCH: 31.5 pg (ref 26.0–34.0)
MCHC: 34.2 g/dL (ref 30.0–36.0)
RDW: 13.1 % (ref 11.5–15.5)

## 2012-02-26 LAB — COMPREHENSIVE METABOLIC PANEL
BUN: 6 mg/dL (ref 6–23)
Calcium: 8.9 mg/dL (ref 8.4–10.5)
GFR calc Af Amer: >90 mL/min (ref >90)
Glucose, Bld: 131 mg/dL — ABNORMAL HIGH (ref 70–99)
Total Protein: 5.6 g/dL — ABNORMAL LOW (ref 6.0–8.3)

## 2012-02-26 LAB — CARDIAC PANEL(CRET KIN+CKTOT+MB+TROPI)
Relative Index: 2.3 (ref 0.0–2.5)
Troponin I: 0.37 ng/mL (ref <0.30)

## 2012-02-26 MED ORDER — ATORVASTATIN CALCIUM 10 MG PO TABS
10.0000 mg | ORAL_TABLET | Freq: Every day | ORAL | Status: DC
  Filled 2012-02-26: qty 1

## 2012-02-26 MED ORDER — SIMVASTATIN 20 MG PO TABS
20.0000 mg | ORAL_TABLET | Freq: Every day | ORAL | Status: DC
  Administered 2012-02-26 – 2012-03-02 (×6): 20 mg via ORAL
  Filled 2012-02-26 (×8): qty 1

## 2012-02-26 MED ORDER — METOPROLOL TARTRATE 25 MG PO TABS
25.0000 mg | ORAL_TABLET | Freq: Two times a day (BID) | ORAL | Status: DC
  Administered 2012-02-26 – 2012-03-03 (×11): 25 mg via ORAL
  Filled 2012-02-26 (×14): qty 1

## 2012-02-26 MED ORDER — ASPIRIN 81 MG PO CHEW
81.0000 mg | CHEWABLE_TABLET | Freq: Every day | ORAL | Status: DC
  Administered 2012-02-26 – 2012-03-02 (×5): 81 mg via ORAL
  Filled 2012-02-26 (×5): qty 1

## 2012-02-26 MED ORDER — HEPARIN (PORCINE) IN NACL 100-0.45 UNIT/ML-% IJ SOLN
1000.0000 [IU]/h | INTRAMUSCULAR | Status: DC
  Administered 2012-02-26: 800 [IU]/h via INTRAVENOUS
  Administered 2012-02-27: 1000 [IU]/h via INTRAVENOUS
  Filled 2012-02-26 (×4): qty 250

## 2012-02-26 NOTE — Progress Notes (Signed)
ANTICOAGULATION CONSULT NOTE - Initial Consult  Pharmacy Consult for Heparin Indication: chest pain/ACS  Allergies  Allergen Reactions  . Codeine Shortness Of Breath    Patient Measurements: Height: 5\' 6"  (167.6 cm) Weight: 128 lb 8.5 oz (58.3 kg) IBW/kg (Calculated) : 59.3  Heparin Dosing Weight: 58.3 kg  Vital Signs: Temp: 99.2 F (37.3 C) (06/08 1300) Temp src: Oral (06/08 1300) Pulse Rate: 76  (06/08 0814)  Labs:  Basename 02/26/12 1120 02/26/12 0330 02/26/12 0320 02/25/12 2000 02/25/12 0400  HGB -- 12.6 -- -- 12.7  HCT -- 36.8 -- -- 36.6  PLT -- 163 -- -- 155  APTT -- -- -- -- --  LABPROT -- -- -- -- --  INR -- -- -- -- --  HEPARINUNFRC -- -- -- -- --  CREATININE -- 0.52 -- -- 0.67  CKTOTAL 158 -- 195* 255* --  CKMB 3.7 -- 3.9 3.4 --  TROPONINI 0.37* -- 0.37* <0.30 --    Estimated Creatinine Clearance: 58.5 ml/min (by C-G formula based on Cr of 0.52).   Medical History: Past Medical History  Diagnosis Date  . Hyperlipidemia     takes Pravastatin nightly  . COPD (chronic obstructive pulmonary disease)   . Left bundle branch block   . Tobacco abuse     50 pack years; one pack per day  . Lung mass     right  . Cancer     Medications:  Scheduled:    . aspirin  81 mg Oral Daily  . bisacodyl  10 mg Oral Daily  . fentaNYL   Intravenous Q4H  . metoprolol tartrate  25 mg Oral BID  . nitroGLYCERIN      . simvastatin  20 mg Oral q1800  . DISCONTD: atorvastatin  10 mg Oral q1800  . DISCONTD: enoxaparin (LOVENOX) injection  40 mg Subcutaneous Q24H  . DISCONTD: simvastatin  20 mg Oral q1800   Infusions:    . bupivacaine ON-Q pain pump    . dextrose 5 % and 0.45 % NaCl with KCl 20 mEq/L 20 mL/hr at 02/26/12 1300    Assessment: 70 YOF admitted for VATs assisted R- upper lobectomy (squamous cell carcinoma) on 6/6 now to start IV heparin for NSTEMI.   CBC wnl. Currently on Lovenox 40mg  (0.6mg /kg) sq for DVT ppx- last dose at 0748AM.  Patient weights  58.3 kg.  No bleeding noted.   Goal of Therapy:  Heparin level 0.3-0.7 units/ml Monitor platelets by anticoagulation protocol: Yes   Plan:  1. Heparin (without bolus) at 800 units/hr (8 ml/hr). 2. Heparin level 8 hours after rate initiated. 3. Daily heparin level and CBC.  Link Snuffer, PharmD, BCPS Clinical Pharmacist [phone] 02/26/2012,5:17 PM

## 2012-02-26 NOTE — Progress Notes (Signed)
2 Days Post-Op Procedure(s) (LRB): VIDEO ASSISTED THORACOSCOPY (VATS)/ LOBECTOMY (Right) Subjective: Feels much better this AM No nausea or vomitting Pain well controlled  Objective: Vital signs in last 24 hours: Temp:  [98 F (36.7 C)-98.7 F (37.1 C)] 98.3 F (36.8 C) (06/08 0814) Pulse Rate:  [73-89] 76  (06/08 0814) Cardiac Rhythm:  [-] Normal sinus rhythm;Bundle branch block (06/08 0900) Resp:  [11-20] 11  (06/08 0814) BP: (129-165)/(67-90) 155/85 mmHg (06/08 0320) SpO2:  [93 %-99 %] 95 % (06/08 0814)  Hemodynamic parameters for last 24 hours:    Intake/Output from previous day: 06/07 0701 - 06/08 0700 In: 1302 [I.V.:1300; IV Piggyback:2] Out: 1565 [Urine:1150; Chest Tube:415] Intake/Output this shift: Total I/O In: 440 [P.O.:240; I.V.:200] Out: 150 [Urine:150]  General appearance: alert and no distress Neurologic: intact Heart: regular rate and rhythm Lungs: diminished breath sounds bibasilar Abdomen: normal findings: soft, non-tender Tiny air leak Lab Results:  Basename 02/26/12 0330 02/25/12 0400  WBC 13.6* 9.5  HGB 12.6 12.7  HCT 36.8 36.6  PLT 163 155   BMET:  Basename 02/26/12 0330 02/25/12 0400  NA 136 137  K 4.1 4.0  CL 101 104  CO2 26 25  GLUCOSE 131* 134*  BUN 6 12  CREATININE 0.52 0.67  CALCIUM 8.9 8.6    PT/INR: No results found for this basename: LABPROT,INR in the last 72 hours ABG    Component Value Date/Time   PHART 7.405* 02/25/2012 0401   HCO3 26.2* 02/25/2012 0401   TCO2 27.5 02/25/2012 0401   ACIDBASEDEF 1.1 02/22/2012 1344   O2SAT 99.2 02/25/2012 0401   CBG (last 3)  No results found for this basename: GLUCAP:3 in the last 72 hours  Assessment/Plan: S/P Procedure(s) (LRB): VIDEO ASSISTED THORACOSCOPY (VATS)/ LOBECTOMY (Right) POD # 2 RUL Path T1b, N0, stage IA- family informed  CV- troponin mildly elevated with normal MB, will ask cardiology to see just to be on the safe side  RESP- pulmonary hygiene  D/c posterior  CT  Anterior CT to water seal  Pain control better today  DVT prophylaxis- lovenox  Advance diet   LOS: 2 days    Alicia Huynh C 02/26/2012

## 2012-02-26 NOTE — Consult Note (Signed)
CARDIOLOGY CONSULT NOTE  Patient ID: Alicia Huynh, MRN: [id number], DOB/AGE: 1941-04-22 71 y.o. Admit date: 02/24/2012 Date of Consult: 02/26/2012  Primary Physician: Fredirick Maudlin, MD, MD Primary Cardiologist: RR  Chief Complaint: +Tn   HPI Alicia Huynh is a 71 y.o. female : Was admitted for VATS assisted right upper lobectomy for squamous cell carcinoma. Last night she developed severe right-sided chest pain is described both as burning as well as sharp. It had a crescendo nature to it over minutes. It was unassociated with radiation. It then dissipated. Serial enzymes were notable for minimal elevation that was repeatedly evident at a level is 0.37.  Cardiac risk factors are notable for dyslipidemia, family history, hypertension, smoking (although she has stopped over the last 3 weeks-horaay) she has no history of exercise intolerance or exertional chest pain. She was seen by Dr. Dietrich Pates 2 months ago.  He did not feel like first symptoms at that point were cardiac in origin and no further testing was recommended.     Past Medical History  Diagnosis Date  . Hyperlipidemia     takes Pravastatin nightly  . COPD (chronic obstructive pulmonary disease)   . Left bundle branch block   . Tobacco abuse     50 pack years; one pack per day  . Lung mass     right  . Cancer       Surgical History:  Past Surgical History  Procedure Date  . None   . Cholecystectomy 70yrs ago  . Heel spur surgery     spurs removed-bilateral  . Tubal ligation   . Breast biopsy     yrs ago and it was benign  . Pft      done 02/01/12     Home Meds: Prior to Admission medications   Medication Sig Start Date End Date Taking? Authorizing Provider  pravastatin (PRAVACHOL) 40 MG tablet Take 40 mg by mouth daily.   Yes Historical Provider, MD    Inpatient Medications:    . bisacodyl  10 mg Oral Daily  . enoxaparin (LOVENOX) injection  40 mg Subcutaneous Q24H  . fentaNYL   Intravenous Q4H  .  nitroGLYCERIN      . simvastatin  20 mg Oral q1800  . DISCONTD: atorvastatin  10 mg Oral q1800  . DISCONTD: simvastatin  20 mg Oral q1800    Allergies:  Allergies  Allergen Reactions  . Codeine Shortness Of Breath    History   Social History  . Marital Status: Married    Spouse Name: N/A    Number of Children: N/A  . Years of Education: N/A   Occupational History  . Not on file.   Social History Main Topics  . Smoking status: Former Smoker -- 1.0 packs/day for 50 years    Types: Cigarettes    Quit date: 02/03/2012  . Smokeless tobacco: Never Used  . Alcohol Use: No  . Drug Use: No  . Sexually Active: Not on file   Other Topics Concern  . Not on file   Social History Narrative  . No narrative on file     Family History  Problem Relation Age of Onset  . Lymphoma Mother     84  . Heart attack Father     75  . Heart attack Brother     40 -Bypass X3. another Bypass at [facility] failed  . Heart defect Brother     heart surgery  . Heart attack Daughter  stents X3  . Anesthesia problems Neg Hx   . Hypotension Neg Hx   . Malignant hyperthermia Neg Hx   . Pseudochol deficiency Neg Hx      ROS:  Please see the history of present illness.     All other systems reviewed and negative.    Physical Exam: Blood pressure 155/85, pulse 76, temperature 99.2 F (37.3 C), temperature source Oral, resp. rate 11, height 5\' 6"  (1.676 m), weight 128 lb 8.5 oz (58.3 kg), SpO2 95.00%. General: Well developed, well nourished female in no acute distress. Head: Normocephalic, atraumatic, sclera non-icteric, no xanthomas, nares are without discharge. Lymph Nodes:  none Neck: Negative for carotid bruits. JVD not elevated. Lungs: Clear bilaterally to auscultation without wheezes, rales, or rhonchi. Breathing is unlabored. Heart: RRR with S1 S2. No murmurs, rubs, or gallops appreciated. Abdomen: Soft, non-tender, non-distended with normoactive bowel sounds. No hepatomegaly. No  rebound/guarding. No obvious abdominal masses. Msk:  Strength and tone appear normal for age. Extremities: No clubbing or cyanosis. No edema.  Distal pedal pulses are 2+ and equal bilaterally. Skin: Warm and Dry Neuro: Alert and oriented X 3. CN III-XII intact Grossly normal sensory and motor function . Chest without scoliosis but mild kyphosis  No CVAT Psych:  Responds to questions appropriately with a normal affect.      Labs: Cardiac Enzymes  Basename 02/26/12 1120 02/26/12 0320 02/25/12 2000  CKTOTAL 158 195* 255*  CKMB 3.7 3.9 3.4  TROPONINI 0.37* 0.37* <0.30   CBC Lab Results  Component Value Date   WBC 13.6* 02/26/2012   HGB 12.6 02/26/2012   HCT 36.8 02/26/2012   MCV 92.0 02/26/2012   PLT 163 02/26/2012   PROTIME: No results found for this basename: LABPROT:3,INR:3 in the last 72 hours Chemistry  Lab 02/26/12 0330  NA 136  K 4.1  CL 101  CO2 26  BUN 6  CREATININE 0.52  CALCIUM 8.9  PROT 5.6*  BILITOT 0.6  ALKPHOS 64  ALT 162*  AST 74*  GLUCOSE 131*   Lipids No results found for this basename: CHOL, HDL, LDLCALC, TRIG   BNP No results found for this basename: probnp   Miscellaneous No results found for this basename: DDIMER    Radiology/Studies:  Dg Chest 1 View  02/07/2012  *RADIOLOGY REPORT*  Clinical Data: Right lung biopsy.  CHEST - 1 VIEW  Comparison: Chest CT 02/07/2012.  Findings: No postprocedural pneumothorax is identified.  The lungs are clear.  Stable appearance of the right lung nodule without evidence for significant pulmonary hemorrhage.  IMPRESSION:  No postprocedural pneumothorax or evidence of significant pulmonary hemorrhage.  Original Report Authenticated By: P. Loralie Champagne, M.D.   Dg Chest 2 View  02/22/2012  *RADIOLOGY REPORT*  Clinical Data: Pulmonary mass  CHEST - 2 VIEW  Comparison: 01/18/2012  Findings: Heart size and mediastinal contours are normal.  Right upper lobe pulmonary nodules again noted measuring approximately 1.9 cm.  No  pleural effusion or edema.  No airspace consolidation identified.  Review of the visualized osseous structures is unremarkable.  IMPRESSION:  1.  No acute cardiopulmonary abnormalities 2.  Right upper lobe pulmonary nodule.  Original Report Authenticated By: Rosealee Albee, M.D.   Ct Biopsy  02/07/2012  *RADIOLOGY REPORT*  Clinical data:  Hypermetabolic right upper lobe lung nodule.  No evidence of metastatic disease on PET-CT.  CT-GUIDED CORE LUNG BIOPSY  Technique and findings: The procedure, risks (including but not limited to bleeding, infection, organ damage, pneumothorax, chest  tube), benefits, and alternatives were explained to the patient. Questions regarding the procedure were encouraged and answered. The patient understands and consents to the procedure.Select axial scans through the upper thorax were obtained and the lesion was localized.  An appropriate skin entry site was determined. Operator donned sterile gloves and mask.   Site was marked, prepped with Betadine, draped in usual sterile fashion, infiltrated locally with 1% lidocaine.  Intravenous Fentanyl and Versed were administered as conscious sedation during continuous cardiorespiratory monitoring by the radiology RN, with a total moderate sedation time of 36 minutes.  Under CT fluoroscopic guidance, a 17 gauge trocar needle was advanced to the margin of the lesion.  Once needle tip position was confirmed, coaxial 18-gauge core biopsy samples were obtained.  The guide needle was removed.  Postprocedure scans show regional alveolar hemorrhage.  No pneumothorax.  The patient remained stable.  IMPRESSION: 1.  Technically successful CT-guided core biopsy of right upper lobe lung nodule  Original Report Authenticated By: Osa Craver, M.D.   Dg Chest Port 1 View  02/26/2012  *RADIOLOGY REPORT*  Clinical Data: Status post VATS for right upper lobectomy. Squamous cell lung cancer.  PORTABLE CHEST - 1 VIEW  Comparison: Portable chest  02/25/2012.  Findings: A right IJ line is stable.  The heart size is normal.  To right-sided chest tubes are in place.  A tiny pneumothorax is suspected.  Prominence of the right hilum is stable.  Mild basilar atelectasis is noted at the left lung.  IMPRESSION:  1.  Tiny right apical pneumothorax following right upper lobectomy with two chest tubes in place. 2.  Stable prominence of the right hilum. 3.  Stable left basilar atelectasis.  Original Report Authenticated By: Jamesetta Orleans. MATTERN, M.D.   Dg Chest Port 1 View  02/25/2012  *RADIOLOGY REPORT*  Clinical Data: Postop.  PORTABLE CHEST - 1 VIEW  Comparison: 02/24/2012.  Findings: Post right lung surgery with prominence of the right hilum.  This may represent simple postoperative changes with the increase in size related to differences in technique rather than postoperative hemorrhage.  Two right-sided chest tubes are in place without evidence of gross pneumothorax.  Right central line unchanged in position.  It is difficult to confirm that this is within the superior vena cava in the upper mediastinum although the tip projects over the expected aspect of the mid superior vena cava.  Correlation with blood return recommended.  Tortuous aorta.  Cardiomegaly.  Central pulmonary vascular prominence.  Left base atelectasis.  IMPRESSION: Increase in appearance of right hilar postoperative changes may be related to magnification.  Please see above discussion.  Original Report Authenticated By: Fuller Canada, M.D.   Dg Chest Portable 1 View  02/24/2012  *RADIOLOGY REPORT*  Clinical Data: Postop.  PORTABLE CHEST - 1 VIEW  Comparison: 02/22/2012  Findings: Postoperative changes on the right.  Two right chest tubes are in place.  Right central line tip is in the SVC.  No pneumothorax.  Small amount of subcutaneous air.  No visible pneumothorax.  Soft tissue in the right perihilar region, presumably postoperative.  Left base atelectasis.  Borderline heart size.  No  visible effusions.  IMPRESSION: Postoperative changes on the right.  No visible pneumothorax.  Left base atelectasis.  Original Report Authenticated By: Cyndie Chime, M.D.    EKG: lbbb    Assessment and Plan:  Patient Active Hospital Problem List: Lung nodule (01/24/2012)   NSTEMI (non-ST elevated myocardial infarction) (02/26/2012)   Left  bundle branch block ()    The patient has atypical chest pain in the setting of multiple cardiac risk factors borderline troponins that were abnormal and a temporal sequence concerning for cardiac not operative event  I think based on the above that further evaluation is indicated. I have discussed this with the family and we will proceed with cardiac catheterization. I discussed also with Dr. Marguerite Olea who feels it is reasonable to begin her on heparin without bolus. We will begin her on aspirin as well as beta blockers   Sherryl Manges

## 2012-02-27 ENCOUNTER — Inpatient Hospital Stay (HOSPITAL_COMMUNITY): Payer: Medicare FFS

## 2012-02-27 LAB — CBC
HCT: 35.2 % — ABNORMAL LOW (ref 36.0–46.0)
Hemoglobin: 11.9 g/dL — ABNORMAL LOW (ref 12.0–15.0)
MCH: 31.7 pg (ref 26.0–34.0)
MCV: 93.9 fL (ref 78.0–100.0)
RBC: 3.75 MIL/uL — ABNORMAL LOW (ref 3.87–5.11)
WBC: 9.1 10*3/uL (ref 4.0–10.5)

## 2012-02-27 LAB — TYPE AND SCREEN: Unit division: 0

## 2012-02-27 LAB — CARDIAC PANEL(CRET KIN+CKTOT+MB+TROPI)
CK, MB: 2.7 ng/mL (ref 0.3–4.0)
Relative Index: 2.1 (ref 0.0–2.5)
Troponin I: <0.3 ng/mL (ref <0.30)

## 2012-02-27 LAB — HEPATIC FUNCTION PANEL
AST: 69 U/L — ABNORMAL HIGH (ref 0–37)
Albumin: 2.8 g/dL — ABNORMAL LOW (ref 3.5–5.2)
Total Bilirubin: 0.6 mg/dL (ref 0.3–1.2)

## 2012-02-27 MED ORDER — SODIUM CHLORIDE 0.9 % IV SOLN
1.0000 mL/kg/h | INTRAVENOUS | Status: DC
  Administered 2012-02-28: 1 mL/kg/h via INTRAVENOUS

## 2012-02-27 MED ORDER — ASPIRIN 81 MG PO CHEW
324.0000 mg | CHEWABLE_TABLET | ORAL | Status: AC
  Administered 2012-02-28: 324 mg via ORAL
  Filled 2012-02-27: qty 4

## 2012-02-27 MED ORDER — OXYCODONE-ACETAMINOPHEN 5-325 MG PO TABS
1.0000 | ORAL_TABLET | ORAL | Status: DC | PRN
  Administered 2012-02-27 – 2012-02-28 (×4): 2 via ORAL
  Administered 2012-02-28: 1 via ORAL
  Administered 2012-02-29 (×4): 2 via ORAL
  Administered 2012-02-29 (×3): 1 via ORAL
  Administered 2012-03-01 – 2012-03-02 (×2): 2 via ORAL
  Administered 2012-03-02 – 2012-03-03 (×3): 1 via ORAL
  Filled 2012-02-27: qty 1
  Filled 2012-02-27: qty 2
  Filled 2012-02-27 (×3): qty 1
  Filled 2012-02-27 (×3): qty 2
  Filled 2012-02-27: qty 1
  Filled 2012-02-27 (×3): qty 2
  Filled 2012-02-27 (×2): qty 1
  Filled 2012-02-27 (×3): qty 2

## 2012-02-27 MED ORDER — SODIUM CHLORIDE 0.9 % IJ SOLN: 3.0000 mL | INTRAMUSCULAR | Status: DC | PRN

## 2012-02-27 MED ORDER — SODIUM CHLORIDE 0.9 % IV SOLN: 250.0000 mL | INTRAVENOUS | Status: DC | PRN

## 2012-02-27 MED ORDER — SODIUM CHLORIDE 0.9 % IJ SOLN: 3.0000 mL | Freq: Two times a day (BID) | INTRAMUSCULAR | Status: DC

## 2012-02-27 NOTE — Progress Notes (Signed)
ANTICOAGULATION CONSULT NOTE - Follow Up Consult  Pharmacy Consult for heparin Indication: NSTEMI  Labs:  Basename 02/27/12 0234 02/26/12 1120 02/26/12 0330 02/26/12 0320 02/25/12 2000 02/25/12 0400  HGB 11.9* -- 12.6 -- -- --  HCT 35.2* -- 36.8 -- -- 36.6  PLT 160 -- 163 -- -- 155  APTT -- -- -- -- -- --  LABPROT -- -- -- -- -- --  INR -- -- -- -- -- --  HEPARINUNFRC 0.24* -- -- -- -- --  CREATININE -- -- 0.52 -- -- 0.67  CKTOTAL -- 158 -- 195* 255* --  CKMB -- 3.7 -- 3.9 3.4 --  TROPONINI -- 0.37* -- 0.37* <0.30 --     Assessment: 70yo female subtherapeutic on heparin with initial dosing for NSTEMI.  Goal of Therapy:  Heparin level 0.3-0.7 units/ml   Plan:  Will increase heparin gtt to 900 units/hr and check level in 8hr.  Colleen Can PharmD BCPS 02/27/2012,4:40 AM

## 2012-02-27 NOTE — Progress Notes (Signed)
  Patient Name: Alicia Huynh      SUBJECTIVE:without recurrent chest pain  Past Medical History  Diagnosis Date  . Hyperlipidemia     takes Pravastatin nightly  . COPD (chronic obstructive pulmonary disease)   . Left bundle branch block   . Tobacco abuse     50 pack years; one pack per day  . Lung mass     right  . Cancer     PHYSICAL EXAM Filed Vitals:   02/26/12 2300 02/27/12 0000 02/27/12 0300 02/27/12 0400  BP: 99/68  120/63   Pulse: 64  65   Temp: 98.7 F (37.1 C)  98.6 F (37 C)   TempSrc: Oral  Oral   Resp: 14 14 16 16   Height:      Weight:      SpO2: 96% 95% 96% 96%   Well developed and nourished in no acute distress HENT normal Neck supple with JVP-flat Clear Regular rate and rhythm, no murmurs or gallops Abd-soft with active BS No Clubbing cyanosis edema Skin-warm and dry A & Oriented  Grossly normal sensory and motor function   TELEMETRY: Reviewed telemetry pt in NSR:    Intake/Output Summary (Last 24 hours) at 02/27/12 0853 Last data filed at 02/27/12 0600  Gross per 24 hour  Intake 1495.33 ml  Output    720 ml  Net 775.33 ml    LABS: Basic Metabolic Panel:  Lab 02/26/12 1610 02/25/12 0400 02/22/12 1344  NA 136 137 142  K 4.1 4.0 4.1  CL 101 104 108  CO2 26 25 20   GLUCOSE 131* 134* 89  BUN 6 12 15   CREATININE 0.52 0.67 0.72  CALCIUM 8.9 8.6 --  MG -- -- --  PHOS -- -- --   Cardiac Enzymes:  Basename 02/26/12 1120 02/26/12 0320 02/25/12 2000  CKTOTAL 158 195* 255*  CKMB 3.7 3.9 3.4  CKMBINDEX -- -- --  TROPONINI 0.37* 0.37* <0.30   CBC:  Lab 02/27/12 0234 02/26/12 0330 02/25/12 0400 02/22/12 1344  WBC 9.1 13.6* 9.5 8.3  NEUTROABS -- -- -- --  HGB 11.9* 12.6 12.7 14.3  HCT 35.2* 36.8 36.6 40.9  MCV 93.9 92.0 92.7 90.9  PLT 160 163 155 194   PROTIME: No results found for this basename: LABPROT:3,INR:3 in the last 72 hours Liver Function Tests:  Basename 02/26/12 0330  AST 74*  ALT 162*  ALKPHOS 64  BILITOT 0.6    PROT 5.6*  ALBUMIN 3.1*    ASSESSMENT AND PLAN:  Patient Active Hospital Problem List: Lung nodule (01/24/2012)   NSTEMI (non-ST elevated myocardial infarction) (02/26/2012)   Left bundle branch block ()   coninue heparin. BB and asa  Cath in am  Will recheck LFTs and Cardiac panel   Signed, Sherryl Manges MD  02/27/2012

## 2012-02-27 NOTE — Progress Notes (Signed)
ANTICOAGULATION CONSULT NOTE - Follow Up Consult  Pharmacy Consult for heparin Indication: NSTEMI  Labs:  Basename 02/27/12 1259 02/27/12 1010 02/27/12 0234 02/26/12 1120 02/26/12 0330 02/26/12 0320 02/25/12 0400  HGB -- -- 11.9* -- 12.6 -- --  HCT -- -- 35.2* -- 36.8 -- 36.6  PLT -- -- 160 -- 163 -- 155  APTT -- -- -- -- -- -- --  LABPROT -- -- -- -- -- -- --  INR -- -- -- -- -- -- --  HEPARINUNFRC 0.30 -- 0.24* -- -- -- --  CREATININE -- -- -- -- 0.52 -- 0.67  CKTOTAL -- 126 -- 158 -- 195* --  CKMB -- 2.7 -- 3.7 -- 3.9 --  TROPONINI -- <0.30 -- 0.37* -- 0.37* --     Assessment: 71yo female subtherapeutic on heparin with initial dosing for NSTEMI.  Goal of Therapy:  Heparin level 0.3-0.7 units/ml   Plan:  Will increase heparin gtt to 1000 units/hr and check level in AM  Elwin Sleight PharmD BCPS 02/27/2012,2:10 PM

## 2012-02-27 NOTE — Progress Notes (Addendum)
3 Days Post-Op Procedure(s) (LRB): VIDEO ASSISTED THORACOSCOPY (VATS)/ LOBECTOMY (Right)  Subjective: Patient with constipation.  Objective: Vital signs in last 24 hours: Patient Vitals for the past 24 hrs:  BP Temp Temp src Pulse Resp SpO2  02/27/12 0800 120/65 mmHg 98.5 F (36.9 C) Oral 67  17  95 %  02/27/12 0400 - - - - 16  96 %  02/27/12 0300 120/63 mmHg 98.6 F (37 C) Oral 65  16  96 %  02/27/12 0000 - - - - 14  95 %  02/26/12 2300 99/68 mmHg 98.7 F (37.1 C) Oral 64  14  96 %  02/26/12 2200 - - - 70  - 94 %  02/26/12 2119 109/57 mmHg - - 76  - -  02/26/12 2100 - - - 73  - 90 %  02/26/12 2000 - - - 76  11  90 %  02/26/12 1929 - - - - 11  90 %  02/26/12 1900 91/52 mmHg 98.4 F (36.9 C) Oral 76  11  89 %  02/26/12 1713 85/69 mmHg 97.7 F (36.5 C) Oral 83  19  95 %  02/26/12 1300 92/50 mmHg 99.2 F (37.3 C) Oral 83  20  96 %    Current Weight  02/24/12 128 lb 8.5 oz (58.3 kg)      Intake/Output from previous day: 06/08 0701 - 06/09 0700 In: 1872.3 [P.O.:960; I.V.:912.3] Out: 870 [Urine:750; Chest Tube:120]   Physical Exam:  Cardiovascular: RRR, no murmurs, gallops, or rubs. Pulmonary: Clear to auscultation bilaterally; no rales, wheezes, or rhonchi. Abdomen: Soft, non tender, bowel sounds present. Extremities: Mild bilateral lower extremity edema. Wounds: Clean and dry.  No erythema or signs of infection.  Lab Results: CBC: Basename 02/27/12 0234 02/26/12 0330  WBC 9.1 13.6*  HGB 11.9* 12.6  HCT 35.2* 36.8  PLT 160 163   BMET:  Basename 02/26/12 0330 02/25/12 0400  NA 136 137  K 4.1 4.0  CL 101 104  CO2 26 25  GLUCOSE 131* 134*  BUN 6 12  CREATININE 0.52 0.67  CALCIUM 8.9 8.6    PT/INR: No results found for this basename: LABPROT,INR in the last 72 hours ABG:  INR: Will add last result for INR, ABG once components are confirmed Will add last 4 CBG results once components are confirmed  Assessment/Plan:  1. CV - s/p NSTEMI, left bundle  branch block.Continue heparin gttp and Lopressor.Per cardiology, cath in am. 2.  Pulmonary - Chest tube output 120 cc last 24 hours.Chest tube is to water seal. No air leak.CXR this am is stable.Probable remove remaining chest tube. Encourage incentive spirometer. 3.H and H this am 11.9 and 35.2. 4.Remove PCA if chest tube removed.Patient is highly allergic to codeine. Will remove oxy and give Ultram and tylenol prn. 5.At patient request, prunes for constipation.   ZIMMERMAN,DONIELLE MPA-C 02/27/2012  No further episodes of the severe CP she had Friday night Stable on heparin CT with minimal drainage overnight and no air leak- will d/c For cath tomorrow

## 2012-02-28 ENCOUNTER — Inpatient Hospital Stay (HOSPITAL_COMMUNITY): Payer: Medicare FFS

## 2012-02-28 ENCOUNTER — Encounter (HOSPITAL_COMMUNITY)
Admission: RE | Disposition: A | Discharge status: Home / Self Care | Payer: Self-pay | Source: Ambulatory Visit | Attending: Thoracic Surgery (Cardiothoracic Vascular Surgery)

## 2012-02-28 DIAGNOSIS — I214 Non-ST elevation (NSTEMI) myocardial infarction: Secondary | ICD-10-CM

## 2012-02-28 HISTORY — PX: LEFT HEART CATHETERIZATION WITH CORONARY ANGIOGRAM: SHX5451

## 2012-02-28 LAB — CBC
HCT: 35.9 % — ABNORMAL LOW (ref 36.0–46.0)
Hemoglobin: 11.8 g/dL — ABNORMAL LOW (ref 12.0–15.0)
MCV: 93 fL (ref 78.0–100.0)
Platelets: 157 10*3/uL (ref 150–400)
RBC: 3.86 MIL/uL — ABNORMAL LOW (ref 3.87–5.11)
WBC: 8.7 10*3/uL (ref 4.0–10.5)

## 2012-02-28 SURGERY — LEFT HEART CATHETERIZATION WITH CORONARY ANGIOGRAM
Anesthesia: LOCAL

## 2012-02-28 MED ORDER — FENTANYL CITRATE 0.05 MG/ML IJ SOLN
25.0000 ug | INTRAMUSCULAR | Status: DC | PRN
  Administered 2012-02-28: 25 ug via INTRAVENOUS
  Filled 2012-02-28: qty 2

## 2012-02-28 MED ORDER — HEPARIN (PORCINE) IN NACL 2-0.9 UNIT/ML-% IJ SOLN
INTRAMUSCULAR | Status: AC
  Filled 2012-02-28: qty 2000

## 2012-02-28 MED ORDER — MIDAZOLAM HCL 2 MG/2ML IJ SOLN
INTRAMUSCULAR | Status: AC
  Filled 2012-02-28: qty 2

## 2012-02-28 MED ORDER — LIDOCAINE HCL (PF) 1 % IJ SOLN
INTRAMUSCULAR | Status: AC
  Filled 2012-02-28: qty 30

## 2012-02-28 MED ORDER — NITROGLYCERIN 0.2 MG/ML ON CALL CATH LAB
INTRAVENOUS | Status: AC
  Filled 2012-02-28: qty 1

## 2012-02-28 MED ORDER — PNEUMOCOCCAL VAC POLYVALENT 25 MCG/0.5ML IJ INJ
0.5000 mL | INJECTION | INTRAMUSCULAR | Status: AC
  Administered 2012-02-29: 0.5 mL via INTRAMUSCULAR
  Filled 2012-02-28: qty 0.5

## 2012-02-28 MED ORDER — VERAPAMIL HCL 2.5 MG/ML IV SOLN
INTRAVENOUS | Status: AC
  Filled 2012-02-28: qty 2

## 2012-02-28 MED ORDER — SODIUM CHLORIDE 0.9 % IV SOLN
1.0000 mL/kg/h | INTRAVENOUS | Status: AC
  Administered 2012-02-28: 1 mL/kg/h via INTRAVENOUS

## 2012-02-28 NOTE — Progress Notes (Signed)
TR BAND REMOVAL  LOCATION:  right radial  DEFLATED PER PROTOCOL:  yes  TIME BAND OFF / DRESSING APPLIED:   1830   SITE UPON ARRIVAL:   Level 0  SITE AFTER BAND REMOVAL:  Level 0  REVERSE ALLEN'S TEST:    positive  CIRCULATION SENSATION AND MOVEMENT:  Within Normal Limits  yes  COMMENTS:     

## 2012-02-28 NOTE — Progress Notes (Signed)
ANTICOAGULATION CONSULT NOTE - Follow Up Consult  Pharmacy Consult for Heparin Indication: NSTEMI  Allergies  Allergen Reactions  . Codeine Shortness Of Breath    Patient Measurements: Height: 5\' 6"  (167.6 cm) Weight: 132 lb 15 oz (60.3 kg) (60.3kg) IBW/kg (Calculated) : 59.3  Heparin Dosing Weight:   Vital Signs: Temp: 98 F (36.7 C) (06/10 0803) Temp src: Oral (06/10 0803) BP: 111/47 mmHg (06/10 0803) Pulse Rate: 72  (06/10 0803)  Labs:  Basename 02/28/12 0424 02/27/12 1259 02/27/12 1010 02/27/12 0234 02/26/12 1120 02/26/12 0330 02/26/12 0320  HGB 11.8* -- -- 11.9* -- -- --  HCT 35.9* -- -- 35.2* -- 36.8 --  PLT 157 -- -- 160 -- 163 --  APTT -- -- -- -- -- -- --  LABPROT -- -- -- -- -- -- --  INR -- -- -- -- -- -- --  HEPARINUNFRC 0.38 0.30 -- 0.24* -- -- --  CREATININE -- -- -- -- -- 0.52 --  CKTOTAL -- -- 126 -- 158 -- 195*  CKMB -- -- 2.7 -- 3.7 -- 3.9  TROPONINI -- -- <0.30 -- 0.37* -- 0.37*    Estimated Creatinine Clearance: 59.5 ml/min (by C-G formula based on Cr of 0.52).   Medications:  Scheduled:    . aspirin  324 mg Oral Pre-Cath  . aspirin  81 mg Oral Daily  . bisacodyl  10 mg Oral Daily  . fentaNYL   Intravenous Q4H  . metoprolol tartrate  25 mg Oral BID  . simvastatin  20 mg Oral q1800  . sodium chloride  3 mL Intravenous Q12H    Assessment: 71yo female with NSTEMI, s/p VATS & R-lobectomy pod#4, for cath today.  Heparin level is therapeutic on current rate of 1000 units/hr.  CBC is stable.  Goal of Therapy:  Heparin level 0.3-0.7 units/ml Monitor platelets by anticoagulation protocol: Yes   Plan:  1.  Continue current rate 2.  F/U after cath today  Marisue Humble, PharmD Clinical Pharmacist [facility]

## 2012-02-28 NOTE — Progress Notes (Addendum)
4 Days Post-Op Procedure(s) (LRB): VIDEO ASSISTED THORACOSCOPY (VATS)/ LOBECTOMY (Right) Subjective: Alicia Huynh has no new complaints this morning.   Objective: Vital signs in last 24 hours: Temp:  [97.7 F (36.5 C)-98.6 F (37 C)] 98 F (36.7 C) (06/10 0803) Pulse Rate:  [65-80] 72  (06/10 0803) Cardiac Rhythm:  [-] Normal sinus rhythm (06/10 0803) Resp:  [11-23] 20  (06/10 0803) BP: (101-154)/(47-64) 111/47 mmHg (06/10 0803) SpO2:  [94 %-98 %] 97 % (06/10 0803) FiO2 (%):  [24 %] 24 % (06/10 0400) Weight:  [132 lb 15 oz (60.3 kg)] 132 lb 15 oz (60.3 kg) (06/09 2207)  Intake/Output from previous day: 06/09 0701 - 06/10 0700 In: 1530.1 [P.O.:540; I.V.:990.1] Out: 0  Intake/Output this shift: Total I/O In: 247.6 [I.V.:247.6] Out: -   General appearance: alert, cooperative and no distress Heart: regular rate and rhythm Lungs: clear to auscultation bilaterally Abdomen: soft, non-tender; bowel sounds normal; no masses,  no organomegaly Wound: clean and dry  Lab Results:  Basename 02/28/12 0424 02/27/12 0234  WBC 8.7 9.1  HGB 11.8* 11.9*  HCT 35.9* 35.2*  PLT 157 160   BMET:  Basename 02/26/12 0330  NA 136  K 4.1  CL 101  CO2 26  GLUCOSE 131*  BUN 6  CREATININE 0.52  CALCIUM 8.9    PT/INR: No results found for this basename: LABPROT,INR in the last 72 hours ABG    Component Value Date/Time   PHART 7.405* 02/25/2012 0401   HCO3 26.2* 02/25/2012 0401   TCO2 27.5 02/25/2012 0401   ACIDBASEDEF 1.1 02/22/2012 1344   O2SAT 99.2 02/25/2012 0401   CBG (last 3)  No results found for this basename: GLUCAP:3 in the last 72 hours  Assessment/Plan: S/P Procedure(s) (LRB): VIDEO ASSISTED THORACOSCOPY (VATS)/ LOBECTOMY (Right)  2. CV- S/P NSTEMI- plan for cath today 3. CXR shows stable right apical pneumothorax, continue IS 4. Acute post operative anemis- stable 5. Dispo- patient doing well, will continue to follow post cath    LOS: 4 days    Huynh,  Alicia 02/28/2012   Patient seen and examined.  Getting ready to go to cath now. Just about ready for d/c from surgery standpoint, await results of cath and whether any additional procedures need to be done with regard to the heart

## 2012-02-28 NOTE — CV Procedure (Signed)
   Cardiac Catheterization Procedure Note  Name: Alicia Huynh MRN: [id number] DOB: [birth year]  Procedure: Left Heart Cath, Selective Coronary Angiography, LV angiography  Indication: 71 year old white female status post lung surgery who had a perioperative non-ST elevation myocardial infarction.   Procedural Details: The right wrist was prepped, draped, and anesthetized with 1% lidocaine. Using the modified Seldinger technique, a 5 French sheath was introduced into the right radial artery. 3 mg of verapamil was administered through the sheath, weight-based unfractionated heparin was administered intravenously. Standard Judkins catheters were used for selective coronary angiography and left ventriculography. Catheter exchanges were performed over an exchange length guidewire. There were no immediate procedural complications. A TR band was used for radial hemostasis at the completion of the procedure.  The patient was transferred to the post catheterization recovery area for further monitoring.  Procedural Findings: Hemodynamics: AO 128/62 mean 88 mmHg LV 128/15  Coronary angiography: Coronary dominance: right  Left mainstem: Normal  Left anterior descending (LAD): Minimal irregularities less than 10%.  Left circumflex (LCx): Mild irregularities in the mid vessel up to 20%.  Right coronary artery (RCA): Mild nonobstructive coronary disease in the proximal to mid vessel up to 20-30%.  Left ventriculography: Left ventricular systolic function is normal, LVEF is estimated at 55-65%, there is no significant mitral regurgitation   Final Conclusions:  1. Nonobstructive coronary disease. 2. Normal left ventricular function.  Recommendations: I suspect this patient's findings cardiac enzymes related to postoperative stress/demand versus spasm. Recommend medical management.  Theron Arista [facility] 02/28/2012, 3:09 PM

## 2012-02-28 NOTE — Progress Notes (Signed)
Utilization review completed.  

## 2012-02-28 NOTE — Interval H&P Note (Signed)
History and Physical Interval Note:  02/28/2012 2:33 PM  Alicia Huynh  has presented today for surgery, with the diagnosis of cp  The various methods of treatment have been discussed with the patient and family. After consideration of risks, benefits and other options for treatment, the patient has consented to  Procedure(s) (LRB): LEFT HEART CATHETERIZATION WITH CORONARY ANGIOGRAM (N/A) as a surgical intervention .  The patients' history has been reviewed, patient examined, no change in status, stable for surgery.  I have reviewed the patients' chart and labs.  Questions were answered to the patient's satisfaction.     Theron Arista [facility] 02/28/2012 Not otherwise examined today.

## 2012-02-28 NOTE — Progress Notes (Signed)
Medicated w/ ultram for rt side chest pain under rt breast and rt side w/ no relief.  Percocet given po and Pt up to chair w/ minimal assist from daughter.  Pain quickly intensified in chair and back to bed w/ assist.  Pt crying, c/o "pulling" sensation in right chest under breast.  Rt lateral sutures intact, incisions well approx, no sob noted but pt breathing shallowly due to pain.  Daughter states fentanyl pca was just d/c'd prior to going to cath lab.  Dr Cornelius Moras notified, order received for fentanyl, 25 mg given IV.  Pt relaxed in bed, states pain "gone."  Daughter remains at bedside.

## 2012-02-28 NOTE — H&P (View-Only) (Signed)
  Patient Name: Alicia Huynh      SUBJECTIVE:without recurrent chest pain  Past Medical History  Diagnosis Date  . Hyperlipidemia     takes Pravastatin nightly  . COPD (chronic obstructive pulmonary disease)   . Left bundle branch block   . Tobacco abuse     50 pack years; one pack per day  . Lung mass     right  . Cancer     PHYSICAL EXAM Filed Vitals:   02/26/12 2300 02/27/12 0000 02/27/12 0300 02/27/12 0400  BP: 99/68  120/63   Pulse: 64  65   Temp: 98.7 F (37.1 C)  98.6 F (37 C)   TempSrc: Oral  Oral   Resp: 14 14 16 16  Height:      Weight:      SpO2: 96% 95% 96% 96%   Well developed and nourished in no acute distress HENT normal Neck supple with JVP-flat Clear Regular rate and rhythm, no murmurs or gallops Abd-soft with active BS No Clubbing cyanosis edema Skin-warm and dry A & Oriented  Grossly normal sensory and motor function   TELEMETRY: Reviewed telemetry pt in NSR:    Intake/Output Summary (Last 24 hours) at 02/27/12 0853 Last data filed at 02/27/12 0600  Gross per 24 hour  Intake 1495.33 ml  Output    720 ml  Net 775.33 ml    LABS: Basic Metabolic Panel:  Lab 02/26/12 0330 02/25/12 0400 02/22/12 1344  NA 136 137 142  K 4.1 4.0 4.1  CL 101 104 108  CO2 26 25 20  GLUCOSE 131* 134* 89  BUN 6 12 15  CREATININE 0.52 0.67 0.72  CALCIUM 8.9 8.6 --  MG -- -- --  PHOS -- -- --   Cardiac Enzymes:  Basename 02/26/12 1120 02/26/12 0320 02/25/12 2000  CKTOTAL 158 195* 255*  CKMB 3.7 3.9 3.4  CKMBINDEX -- -- --  TROPONINI 0.37* 0.37* <0.30   CBC:  Lab 02/27/12 0234 02/26/12 0330 02/25/12 0400 02/22/12 1344  WBC 9.1 13.6* 9.5 8.3  NEUTROABS -- -- -- --  HGB 11.9* 12.6 12.7 14.3  HCT 35.2* 36.8 36.6 40.9  MCV 93.9 92.0 92.7 90.9  PLT 160 163 155 194   PROTIME: No results found for this basename: LABPROT:3,INR:3 in the last 72 hours Liver Function Tests:  Basename 02/26/12 0330  AST 74*  ALT 162*  ALKPHOS 64  BILITOT 0.6    PROT 5.6*  ALBUMIN 3.1*    ASSESSMENT AND PLAN:  Patient Active Hospital Problem List: Lung nodule (01/24/2012)   NSTEMI (non-ST elevated myocardial infarction) (02/26/2012)   Left bundle branch block ()   coninue heparin. BB and asa  Cath in am  Will recheck LFTs and Cardiac panel   Signed, Broghan Pannone MD  02/27/2012   

## 2012-02-29 ENCOUNTER — Inpatient Hospital Stay (HOSPITAL_COMMUNITY): Payer: Medicare FFS

## 2012-02-29 DIAGNOSIS — I214 Non-ST elevation (NSTEMI) myocardial infarction: Secondary | ICD-10-CM

## 2012-02-29 LAB — CBC
HCT: 29.6 % — ABNORMAL LOW (ref 36.0–46.0)
Hemoglobin: 10.3 g/dL — ABNORMAL LOW (ref 12.0–15.0)
MCH: 32.1 pg (ref 26.0–34.0)
MCHC: 34.8 g/dL (ref 30.0–36.0)

## 2012-02-29 MED ORDER — TRAMADOL HCL 50 MG PO TABS: 50.0000 mg | ORAL_TABLET | Freq: Four times a day (QID) | ORAL | Status: DC | PRN

## 2012-02-29 MED ORDER — MAGNESIUM HYDROXIDE 400 MG/5ML PO SUSP
30.0000 mL | Freq: Every day | ORAL | Status: DC
  Administered 2012-03-02: 30 mL via ORAL
  Filled 2012-02-29 (×4): qty 30

## 2012-02-29 MED ORDER — ASPIRIN 81 MG PO CHEW: 81.0000 mg | CHEWABLE_TABLET | Freq: Every day | ORAL | Status: AC

## 2012-02-29 MED ORDER — BISACODYL 10 MG RE SUPP
10.0000 mg | Freq: Once | RECTAL | Status: AC
  Administered 2012-02-29: 10 mg via RECTAL
  Filled 2012-02-29: qty 1

## 2012-02-29 MED ORDER — METOCLOPRAMIDE HCL 5 MG/5ML PO SOLN
10.0000 mg | Freq: Four times a day (QID) | ORAL | Status: DC | PRN
  Filled 2012-02-29: qty 10

## 2012-02-29 MED ORDER — METOPROLOL TARTRATE 25 MG PO TABS: 25.0000 mg | ORAL_TABLET | Freq: Two times a day (BID) | ORAL | Status: DC

## 2012-02-29 MED ORDER — POLYETHYLENE GLYCOL 3350 17 G PO PACK: 17.0000 g | PACK | Freq: Every day | ORAL | Status: AC

## 2012-02-29 MED ORDER — OXYCODONE-ACETAMINOPHEN 5-325 MG PO TABS: 1.0000 | ORAL_TABLET | ORAL | Status: AC | PRN

## 2012-02-29 MED ORDER — ALBUTEROL SULFATE HFA 108 (90 BASE) MCG/ACT IN AERS: 4.0000 | INHALATION_SPRAY | Freq: Four times a day (QID) | RESPIRATORY_TRACT | Status: DC | PRN

## 2012-02-29 MED ORDER — POLYETHYLENE GLYCOL 3350 17 G PO PACK
17.0000 g | PACK | Freq: Every day | ORAL | Status: DC
  Administered 2012-02-29 – 2012-03-02 (×3): 17 g via ORAL
  Filled 2012-02-29 (×5): qty 1

## 2012-02-29 MED ORDER — FLEET ENEMA 7-19 GM/118ML RE ENEM
1.0000 | ENEMA | Freq: Once | RECTAL | Status: AC
  Administered 2012-02-29: 1 via RECTAL
  Filled 2012-02-29 (×2): qty 1

## 2012-02-29 NOTE — Progress Notes (Signed)
1 Day Post-Op Procedure(s) (LRB): LEFT HEART CATHETERIZATION WITH CORONARY ANGIOGRAM (N/A)  Subjective: Ms. Cogliano complains of constipation this morning and some right sided tenderness.   Objective: Vital signs in last 24 hours: Temp:  [97.6 F (36.4 C)-99.1 F (37.3 C)] 97.7 F (36.5 C) (06/11 0802) Pulse Rate:  [63-93] 69  (06/11 0802) Cardiac Rhythm:  [-] Normal sinus rhythm (06/10 2235) Resp:  [16-19] 16  (06/11 0802) BP: (113-138)/(52-81) 129/64 mmHg (06/11 0802) SpO2:  [95 %-100 %] 97 % (06/11 0802) Weight:  [129 lb 13.6 oz (58.9 kg)] 129 lb 13.6 oz (58.9 kg) (06/11 0500)   Intake/Output from previous day: 06/10 0701 - 06/11 0700 In: 1139.6 [P.O.:300; I.V.:839.6] Out: 550 [Urine:550]    General appearance: alert, cooperative and no distress Neurologic: intact Heart: regular rate and rhythm Lungs: clear to auscultation bilaterally Abdomen: soft, non-tender; bowel sounds normal; no masses,  no organomegaly Wound: clean and dry  Lab Results:  Basename 02/29/12 0510 02/28/12 0424  WBC 5.2 8.7  HGB 10.3* 11.8*  HCT 29.6* 35.9*  PLT 150 157   BMET: No results found for this basename: NA:2,K:2,CL:2,CO2:2,GLUCOSE:2,BUN:2,CREATININE:2,CALCIUM:2 in the last 72 hours  PT/INR: No results found for this basename: LABPROT,INR in the last 72 hours ABG    Component Value Date/Time   PHART 7.405* 02/25/2012 0401   HCO3 26.2* 02/25/2012 0401   TCO2 27.5 02/25/2012 0401   ACIDBASEDEF 1.1 02/22/2012 1344   O2SAT 99.2 02/25/2012 0401   CBG (last 3)  No results found for this basename: GLUCAP:3 in the last 72 hours  Assessment/Plan: S/P Procedure(s) (LRB): LEFT HEART CATHETERIZATION WITH CORONARY ANGIOGRAM (N/A)  2. CV- S/P NSTEMI post operatively, cardiac cath done yesterday and did not show significant CAD 3. Acute post operative anemia- Hgb stable 4. Constipation- will order Miralax and dulcolax suppository 5. Dispo- patient doing well, will d/c home this afternoon   LOS: 5  days    Raford Pitcher, Denny Peon 02/29/2012

## 2012-02-29 NOTE — Progress Notes (Signed)
Report received that rt lateral chest drsg has been changed by day RN approx q1h due to serosang drainage.  Drsg on rounds D&I at 1930.  Pt up to bsc and back to bed and called by pt's daughter.  Rt lat chest drsg again saturated w/ serosang drainage. Removed and redressed w/ 4x4's and ABD pad and tape as was previous.  Pain has been controlled today with percocet.  Currently pain free.

## 2012-02-29 NOTE — Discharge Instructions (Signed)
Thoracotomy Care After Refer to this sheet in the next few weeks. These instructions provide you with information on caring for yourself after your procedure. Your caregiver may also give you more specific instructions. Your treatment has been planned according to current medical practices, but problems sometimes occur. Call your caregiver if you have any problems or questions after your procedure. HOME CARE INSTRUCTIONS  Remove the bandage (dressing) over your chest tube site as directed by your caregiver.   It is normal to be sore for a couple weeks following surgery. See your caregiver if this seems to be getting worse rather than better.   Only take over-the-counter or prescription medicines for pain, discomfort, or fever as directed by your caregiver. It is very important to take pain medicine when you need it so that you will cough and breathe deeply enough to clear mucus (phlegm) and expand your lungs. This helps prevent a lung infection (pneumonia).   If it hurts to cough, hold a pillow against your chest when you cough. This may help with the discomfort. In spite of the discomfort, cough frequently.   Taking deep breaths keeps lungs inflated and protects against pneumonia. Most patients will go home with a device called an incentive spirometer that encourages deep breathing.   You may resume a normal diet and activities as directed.   Use showers for bathing until you see your caregiver, or as instructed.   Change dressings if necessary or as directed.   Avoid lifting or driving until you are instructed otherwise.   Make an appointment to see your caregiver for stitch (suture) or staple removal when instructed.   Do not travel by airplane for 2 weeks after the chest tube is removed.  SEEK MEDICAL CARE IF:  You are bleeding from your wounds.   Your heartbeat seems irregular.   You have redness, swelling, or increasing pain in the wounds.   There is pus coming from your  wounds.   There is a bad smell coming from the wound or dressing.  SEEK IMMEDIATE MEDICAL CARE IF:  You have a fever.   You develop a rash.   You have difficulty breathing.   You develop any reaction or side effects to medicines given.   You develop lightheadedness or feel faint.   You develop shortness of breath or chest pain.  MAKE SURE YOU:  Understand these instructions.   Will watch your condition.   Will get help right away if you are not doing well or get worse.  Document Released: 02/19/2011 Document Revised: 08/26/2011 Document Reviewed: 02/19/2011 [facility] Patient Information 2012 [place], [place].  Diet: Heart Healthy Activity: As tolerated, would avoid heavy lifting No Driving while using narcotic pain medication Wound care: keep wounds clean and dry

## 2012-02-29 NOTE — Progress Notes (Signed)
Sutures removed from chest tub sites on right back; draining sanq. Fluid freely; drs'd; esp. After vomitting approx. 400ccDenny Peon, PA examined patient.

## 2012-02-29 NOTE — Progress Notes (Signed)
Called by Mrs. Michaux's nurse.  He stated the patient just had an episode of vomiting with approximately 300-400cc of emesis.  Also her chest tube sites started to drain clean serosanguinous fluid.  The patient had received Miralax with no results.  The patient states she feels okay now.  She states that she drank the Miralax and then just felt sick and vomited.   Exam: patient is pale, abdomen is soft non-tender, non-distended, bowel sounds are hyperactive.  Chest tube sites are clean, there is serosanquinous fluid draining present.  Plan:  1. Will cancel discharge for today 2. Obtain KUB- to rule out Ileus, if present will make patient NPO and start IV Fluids 3. D/C Miralax- patient is willing to attempt suppository  4. Drainage from chest tube sites- likely started due to episode of vomiting.  Most likely dislodged a pocket of fluid. 5. Nausea- given zofran with some relief, will add Reglan as well 6. Will notify staff

## 2012-02-29 NOTE — Discharge Summary (Addendum)
Physician Discharge Summary  Patient ID: Alicia Huynh MRN: [id number] DOB/AGE: Mar 20, 1941 71 y.o.  Admit date: 02/24/2012 Discharge date: 03/03/2012  Admission Diagnoses:  Patient Active Problem List  Diagnosis  . Hyperlipidemia  . COPD (chronic obstructive pulmonary disease)  . Chest pain  . Left bundle branch block  . Tobacco abuse  . Laboratory test  . Lung nodule  . NSTEMI (non-ST elevated myocardial infarction)     Discharge Diagnoses:   Patient Active Problem List  Diagnoses  . Hyperlipidemia  . COPD (chronic obstructive pulmonary disease)  . Chest pain  . Left bundle branch block  . Tobacco abuse  . Laboratory test  . Lung nodule   S/P Right Upper Lobectomy   Squamous Cell Carcinoma of Lung  . NSTEMI (non-ST elevated myocardial infarction)   Discharged Condition: good  History of Present Illness:   Alicia Huynh is a 71 yo female with 50 pack year smoking history who presented to see Dr. Juanetta Gosling and Dietrich Pates with a complaint of left sided chest pain.  Workup did not reveal any evidence of CAD.  However, a chest xray obtained revealed the presence of a right upper lobe nodule.  Follow up CT scan of the chest was performed which confirmed the presence of a right upper lobe nodule measuring 1.7x1.2cm with no evidence of associated hilar or mediastinal lymphadenopathy.  Due to this finding the patient underwent PET CT scan which showed hypermetabolic activity in the nodule with a max SUV of 13.1.  She was subsequently referred to Interventional Radiology for possible biospy.  This was performed on 02/07/2012 which confirmed a diagnosis of Squamous Cell Carcinoma.  Therefore, she was referred to Dr. Dorris Fetch for possible Right Upper Lobectomy.  She was evaluated by Dr. Dorris Fetch on 02/17/2012 who felt that she would benefit from a Right VATS with a right upper lobectomy.  The risks and benefits of the procedure were explained to the patient and she was willing to proceed  with surgery, scheduled for 02/24/12.  Hospital Course:   Alicia Huynh presented to [facility] on 02/24/2012. She was taken to the operating room and underwent a Right Video Assisted Thoracotomy with Right Upper Lobectomy, and Mediastinal lymph node dissection.  The patient tolerated the procedure well.  She was extubated prior to leaving the operating room and was taken to the PACU in stable condition.  POD #1 the patient exhibited a small air leak with chest tubes in place.  The patient's arterial line was removed.  Later that evening patient developed sharp burning chest pain and right arm pain.  Due to a strong family history of CAD, cardiac enzymes were obtained.  POD #2 the patients Troponin level was mildly elevated.  Cardiology was consulted to evaluate the patient and they felt the patient suffered a NSTEMI. She was placed on a heparin drip and remained chest pain free.  The patient's posterior chest tube was removed without difficulty.  POD #3 the patient's final chest tube was removed without difficulty.  POD #4 the patient's chest xray shows a small stable right apical pneumothorax.  She was taken for cardiac catheterization which showed a preserved EF and mild CAD.  It was felt the patients elevated cardiac enzymes were most likely due to post operative stress vs. Spasm.  POD #5 the patient developed nausea and vomiting.  She had been constipated and it was felt this was most likely the cause.  An Abdominal xray was obtained which did not reveal any  evidence of post operative Ileus or bowel obstruction.  POD #6 the patient continued to have nausea and vomiting.  She was given an enema the night before with no production of a bowel movement.  An enema was repeated this morning will resultant BM.  POD #7 the patient is no longer nauseated or vomiting.  She was having bowel movements without difficulty.  POD #8 the patient is doing much better.  She continues to deny nausea and vomiting.   She is tolerating a regular diet and has had multiple bowel movements.  The patient wishes not be prescribed Metoprolol at this time.  I told the patient that she may require this medication long term and should speak to Cardiology about this at her post operative appointment. She is ready for discharge home today.  She is scheduled to follow up with Dr. Sunday Corn office on 03/13/2012 at 2:00pm.  She will also need to follow up with Cardiology and Dr. Juanetta Gosling in 2 weeks time.  Disposition: Home  Discharge Orders    Future Appointments: Provider: Department: Dept Phone: Center:   03/13/2012 2:00 PM [phone]     Medication List  As of 03/03/2012  8:10 AM   TAKE these medications         aspirin 81 MG chewable tablet   Chew 1 tablet (81 mg total) by mouth daily.      metoprolol tartrate 25 MG tablet   Commonly known as: LOPRESSOR   Take 1 tablet (25 mg total) by mouth 2 (two) times daily.      oxyCODONE-acetaminophen 5-325 MG per tablet   Commonly known as: PERCOCET   Take 1-2 tablets by mouth every 4 (four) hours as needed.      polyethylene glycol packet   Commonly known as: MIRALAX / GLYCOLAX   Take 17 g by mouth daily.      pravastatin 40 MG tablet   Commonly known as: PRAVACHOL   Take 40 mg by mouth daily.      traMADol 50 MG tablet   Commonly known as: ULTRAM   Take 1-2 tablets (50-100 mg total) by mouth every 6 (six) hours as needed.           Follow-up Information    Follow up with Loreli Slot, MD on 03/13/2012. (Appointment is at 2:00pm)    Contact information:   [address] [place] [address] [phone]       Follow up with Peter Sw[place], MD. Schedule an appointment as soon as possible for a visit in 2 weeks.   Contact information:   [address] [place] [address] [phone]       Follow up with [facility]. (Please get chest xray 1 hour prior to follow  up appointment with Dr. Dorris Fetch)    Contact information:   [address], [address]      Follow up with HAWKINS,EDWARD L, MD. Schedule an appointment as soon as possible for a visit in 2 weeks.   Contact information:   [address] [place] [address] [phone]          Signed: [name] 03/03/2012, 8:10 AM

## 2012-02-29 NOTE — Progress Notes (Signed)
Patient is doing very well post cath. No radial hematoma. Pulses good. Ok for discharge today from cardiology standpoint. Elois Averitt [place] MD, [facility]  02/29/2012

## 2012-02-29 NOTE — Progress Notes (Signed)
Pt is a smoker of 1 ppd for 60 yeas however since May she has only smoked 2 cigarettes per day. Congratulated pt on cutting down so much. She says her plans are not to smoke again but she can't guarrenty it. She insists on keeping some cigarettes in the house against advice not to. Referred to 1-800 quit now for f/u and support. Discussed oral fixation substitutes, second hand smoke and in home smoking policy. Reviewed and gave pt Written education/contact information.

## 2012-02-29 NOTE — Progress Notes (Addendum)
Pt rested well off and on and has been pain free since fentanyl given.  Medicated q4h with po percocet to prevent pain and allow for being up OOB to bathroom and encourage IS use.  Pt had short burst of atrial tach, resting in bed, denies complaint.  BP 126/60.

## 2012-03-01 LAB — CBC
HCT: 33.2 % — ABNORMAL LOW (ref 36.0–46.0)
HCT: 32.3 % — ABNORMAL LOW (ref 36.0–46.0)
MCH: 31.5 pg (ref 26.0–34.0)
MCH: 32 pg (ref 26.0–34.0)
MCHC: 34.7 g/dL (ref 30.0–36.0)
MCV: 91 fL (ref 78.0–100.0)
Platelets: 183 10*3/uL (ref 150–400)
RDW: 12.9 % (ref 11.5–15.5)
RDW: 13.2 % (ref 11.5–15.5)

## 2012-03-01 LAB — BASIC METABOLIC PANEL
BUN: 14 mg/dL (ref 6–23)
Calcium: 8.5 mg/dL (ref 8.4–10.5)
GFR calc Af Amer: >90 mL/min (ref >90)
GFR calc non Af Amer: >90 mL/min (ref >90)
Potassium: 3.3 mEq/L — ABNORMAL LOW (ref 3.5–5.1)
Sodium: 139 mEq/L (ref 135–145)

## 2012-03-01 MED ORDER — METOCLOPRAMIDE HCL 5 MG/5ML PO SOLN: 10.0000 mg | Freq: Three times a day (TID) | ORAL | Status: DC

## 2012-03-01 MED ORDER — MAGNESIUM CITRATE PO SOLN
1.0000 | Freq: Once | ORAL | Status: DC
  Filled 2012-03-01: qty 296

## 2012-03-01 MED ORDER — KCL IN DEXTROSE-NACL 10-5-0.45 MEQ/L-%-% IV SOLN
INTRAVENOUS | Status: DC
  Administered 2012-03-01 – 2012-03-02 (×2): via INTRAVENOUS
  Administered 2012-03-03: 1000 mL via INTRAVENOUS
  Filled 2012-03-01 (×4): qty 1000

## 2012-03-01 MED ORDER — FENTANYL CITRATE 0.05 MG/ML IJ SOLN
25.0000 ug | INTRAMUSCULAR | Status: DC | PRN
  Administered 2012-03-01 – 2012-03-02 (×7): 25 ug via INTRAVENOUS
  Filled 2012-03-01 (×8): qty 2

## 2012-03-01 MED ORDER — METOCLOPRAMIDE HCL 10 MG PO TABS
10.0000 mg | ORAL_TABLET | Freq: Three times a day (TID) | ORAL | Status: DC
  Administered 2012-03-01 – 2012-03-03 (×8): 10 mg via ORAL
  Filled 2012-03-01 (×12): qty 1

## 2012-03-01 NOTE — Progress Notes (Signed)
2 Days Post-Op Procedure(s) (LRB): LEFT HEART CATHETERIZATION WITH CORONARY ANGIOGRAM (N/A) Subjective:  Alicia Huynh does not feel well this morning.  She complains of nausea and vomiting.  She has not had a bowel movement since last Thursday and has received multiple medications and one enema with no relief.  Objective: Vital signs in last 24 hours: Temp:  [97.7 F (36.5 C)-98.9 F (37.2 C)] 97.8 F (36.6 C) (06/12 0526) Pulse Rate:  [57-75] 75  (06/12 0526) Cardiac Rhythm:  [-] Normal sinus rhythm;Bundle branch block (06/11 2211) Resp:  [14-18] 18  (06/12 0526) BP: (130-138)/(61-69) 130/67 mmHg (06/12 0526) SpO2:  [92 %-98 %] 94 % (06/12 0526) Weight:  [136 lb 0.4 oz (61.7 kg)] 136 lb 0.4 oz (61.7 kg) (06/12 0035)   Intake/Output from previous day: 06/11 0701 - 06/12 0700 In: 440 [P.O.:440] Out: 400 [Emesis/NG output:400]  General appearance: alert, cooperative and no distress Heart: regular rate and rhythm Lungs: clear to auscultation bilaterally Abdomen: soft, non-tender, non-distended, hyperactive bowel sounds Wound: clean, some serosanguinous drainage present  Lab Results:  Basename 03/01/12 0432 02/29/12 0510  WBC 7.2 5.2  HGB 11.5* 10.3*  HCT 33.2* 29.6*  PLT 183 150   BMET: No results found for this basename: NA:2,K:2,CL:2,CO2:2,GLUCOSE:2,BUN:2,CREATININE:2,CALCIUM:2 in the last 72 hours  PT/INR: No results found for this basename: LABPROT,INR in the last 72 hours ABG    Component Value Date/Time   PHART 7.405* 02/25/2012 0401   HCO3 26.2* 02/25/2012 0401   TCO2 27.5 02/25/2012 0401   ACIDBASEDEF 1.1 02/22/2012 1344   O2SAT 99.2 02/25/2012 0401   CBG (last 3)  No results found for this basename: GLUCAP:3 in the last 72 hours  Assessment/Plan: S/P Procedure(s) (LRB): LEFT HEART CATHETERIZATION WITH CORONARY ANGIOGRAM (N/A)  2. CV- rate and pressure controlled, on metoprolol, cardiac catheterization negative for obstructive CAD 3. Constipation- patient now with  nausea and vomiting, no Ileus pattern on Abd film.  Patient has had stool softners, suppository, enema, and Miralax without production of bowel movement.  At point where may benefit from Mag Citrate vs. Digital Disimpaction 4. Nausea- Zofran ordered, will schedule Reglan 5. Will check CBC, BMP today 6. Dispo- patient with constipation and vomiting.  May need to start IV fluids if vomiting persists, Patient encouraged to ambulate around unit and if no bowel movement by afternoon will try Mag Citrate vs Disimpaction   LOS: 6 days    [name] 03/01/2012

## 2012-03-02 LAB — CBC
Hemoglobin: 11.9 g/dL — ABNORMAL LOW (ref 12.0–15.0)
MCH: 31.4 pg (ref 26.0–34.0)
MCV: 90.2 fL (ref 78.0–100.0)
Platelets: 218 10*3/uL (ref 150–400)
RBC: 3.79 MIL/uL — ABNORMAL LOW (ref 3.87–5.11)
WBC: 8.5 10*3/uL (ref 4.0–10.5)

## 2012-03-02 LAB — BASIC METABOLIC PANEL
CO2: 27 mEq/L (ref 19–32)
Chloride: 101 mEq/L (ref 96–112)
Creatinine, Ser: 0.52 mg/dL (ref 0.50–1.10)
Glucose, Bld: 113 mg/dL — ABNORMAL HIGH (ref 70–99)

## 2012-03-02 NOTE — Care Management Note (Unsigned)
    Page 1 of 2   03/02/2012     2:03:49 PM   CARE MANAGEMENT NOTE 03/02/2012  Patient:  Alicia Huynh, Alicia Huynh   Account Number:  [id number]  Date Initiated:  02/24/2012  Documentation initiated by:  Donn Pierini  Subjective/Objective Assessment:   Pt admitted s/p VATS/lobectomy     Action/Plan:   PTA pt lived at home with spouse   Anticipated DC Date:  02/28/2012   Anticipated DC Plan:  HOME/SELF CARE      DC Planning Services  CM consult      Choice offered to / List presented to:             Status of service:  In process, will continue to follow Medicare Important Message given?   (If response is "NO", the following Medicare IM given date fields will be blank) Date Medicare IM given:   Date Additional Medicare IM given:    Discharge Disposition:    Per UR Regulation:  Reviewed for med. necessity/level of care/duration of stay  If discussed at Long Length of Stay Meetings, dates discussed:   03/01/2012    Comments:  PCP- Juanetta Gosling  03/03/11 Chasmine Lender,RN,BSN 1400 MET WITH PT TO DISCUSS DC PLANS.  PT TO DC HOME WITH SUPPORTIVE HUSBAND AND HUSBAND.  WILL FOLLOW FOR HOME NEEDS AS PT PROGRESSES.  03/01/12 4098 Verdis Prime RN MSN CCM D/C delayed 2/2 vomiting, KUB scheduled to r/o SBO.  02/29/12 1630 Henrietta Mayo RN MSN CCM OK to d/c per cards.  02/28/12- 1215- Donn Pierini RN, BSN [phone] UR completed, pt with NSTEMI post op on 02/26/12- for cardiac cath today. NCM to continue to follow, plan remains to return home.  02/24/12- 1530- Donn Pierini RN, BSN [phone] UR completed, pt admitted from PACU s/p VATS, NCM to follow for potenial d/c needs.

## 2012-03-02 NOTE — Progress Notes (Signed)
Called to assess EKG strip, appeared to be a 15 second run of SVT with a max rate of approx. 160. Patient asymptomatic, HR returned to NSR with underlying BBB. Advised bedside Rn to call with further questions, will continue to monitor.

## 2012-03-02 NOTE — Progress Notes (Addendum)
3 Days Post-Op Procedure(s) (LRB): LEFT HEART CATHETERIZATION WITH CORONARY ANGIOGRAM (N/A)  Subjective: [name] feels better this morning.  She denies nausea and had 2 bowel movements yesterday  Objective: Vital signs in last 24 hours: Temp:  [97.7 F (36.5 C)-98.7 F (37.1 C)] 98.3 F (36.8 C) (06/13 0981) Pulse Rate:  [73-101] 101  (06/13 1914) Cardiac Rhythm:  [-] Normal sinus rhythm;Other (Comment);Bundle branch block (06/12 2030) Resp:  [17-18] 18  (06/13 0633) BP: (130-154)/(65-74) 154/74 mmHg (06/13 0633) SpO2:  [94 %-95 %] 95 % (06/13 0633)  Intake/Output from previous day: 06/12 0701 - 06/13 0700 In: 340 [P.O.:340] Out: -     General appearance: alert, cooperative and no distress Heart: regular rate and rhythm Lungs: clear to auscultation bilaterally Abdomen: soft, non-tender; bowel sounds normal; no masses,  no organomegaly Wound: clean and dry  Lab Results:  Basename 03/02/12 0500 03/01/12 0957  WBC 8.5 6.3  HGB 11.9* 11.2*  HCT 34.2* 32.3*  PLT 218 187   BMET:  Basename 03/02/12 0500 03/01/12 0957  NA 137 139  K 3.6 3.3*  CL 101 101  CO2 27 26  GLUCOSE 113* 82  BUN 9 14  CREATININE 0.52 0.54  CALCIUM 8.6 8.5    PT/INR: No results found for this basename: LABPROT,INR in the last 72 hours ABG    Component Value Date/Time   PHART 7.405* 02/25/2012 0401   HCO3 26.2* 02/25/2012 0401   TCO2 27.5 02/25/2012 0401   ACIDBASEDEF 1.1 02/22/2012 1344   O2SAT 99.2 02/25/2012 0401   CBG (last 3)  No results found for this basename: GLUCAP:3 in the last 72 hours  Assessment/Plan: S/P Procedure(s) (LRB): LEFT HEART CATHETERIZATION WITH CORONARY ANGIOGRAM (N/A)  2. CV- Tachy, with HTN this morning, run of SVT yesterday most likely due to volume depletion with vomiting and now diarrhea 3. Constipation- improving, patient with BMs yesterday,  4. Nausea- zofran and reglan ordered 5. Dispo- patient doing better today, nausea and vomiting have resolved.  We will  continue IV fluids today and encourage PO intake.  If patient continues to improve may be able to d/c home tomorrow or Saturday   LOS: 7 days    Alicia Huynh 03/02/2012   Looks better today. Still fells bloated, but abdomen soft on exam. She has had some flatus, but no BM today Nausea much better, but appetite still poor Home when GI issues resolved

## 2012-03-02 NOTE — Progress Notes (Signed)
Patient sleeping on her back. Patient had a run of S.V.T. rate 113-166 lasted approx. 18 sec. Then back to S.R. 90's with BBB. No complaints voiced, resp. even and unlab. Skin warm and dry. R.N. aware . Rapid Response R.N. Philippa Chester, came and looked at stripes and patient. And felt she had a run of S.V.T. Cont. To monitor pt. And rhythm.

## 2012-03-02 NOTE — Progress Notes (Signed)
Pt has ambulated halls with daughter 3 times today.

## 2012-03-03 LAB — CBC
HCT: 34.1 % — ABNORMAL LOW (ref 36.0–46.0)
MCH: 31.3 pg (ref 26.0–34.0)
MCV: 91.9 fL (ref 78.0–100.0)
RBC: 3.71 MIL/uL — ABNORMAL LOW (ref 3.87–5.11)
WBC: 7.7 10*3/uL (ref 4.0–10.5)

## 2012-03-03 NOTE — Progress Notes (Signed)
Pt. Discharged 03/03/2012  10:23 AM Discharge instructions reviewed with patient/family. Patient/family verbalized understanding. All Rx's given. Questions answered as needed. Pt. Discharged to home with family/self.  Alicia Huynh

## 2012-03-03 NOTE — Progress Notes (Addendum)
  4 Days Post-Op Procedure(s) (LRB): LEFT HEART CATHETERIZATION WITH CORONARY ANGIOGRAM (N/A) Subjective: Alicia Huynh has no complaints this morning.  She states she is doing much better and feels up to being discharged.  She is tolerating PO diet with no nausea or vomiting.  She had another bowel movement yesterday which was loose, but she thinks its starting to become more formed.  Objective: Vital signs in last 24 hours: Temp:  [97.7 F (36.5 C)-98.4 F (36.9 C)] 98.4 F (36.9 C) (06/14 0606) Pulse Rate:  [78-81] 80  (06/14 0606) Cardiac Rhythm:  [-] Normal sinus rhythm;Other (Comment);Bundle branch block (06/13 2000) Resp:  [18] 18  (06/14 0606) BP: (118-144)/(75-77) 118/76 mmHg (06/14 0606) SpO2:  [95 %-96 %] 95 % (06/14 0606)  Intake/Output from previous day: 06/13 0701 - 06/14 0700 In: 340 [P.O.:240] Out: -    General appearance: alert, cooperative and no distress Neurologic: intact Heart: regular rate and rhythm Lungs: clear to auscultation bilaterally Abdomen: soft, non-tender; bowel sounds normal; no masses,  no organomegaly Extremities: edema none appreciated Wound: clean and dry  Lab Results:  Basename 03/03/12 0530 03/02/12 0500  WBC 7.7 8.5  HGB 11.6* 11.9*  HCT 34.1* 34.2*  PLT 246 218   BMET:  Basename 03/02/12 0500 03/01/12 0957  NA 137 139  K 3.6 3.3*  CL 101 101  CO2 27 26  GLUCOSE 113* 82  BUN 9 14  CREATININE 0.52 0.54  CALCIUM 8.6 8.5    PT/INR: No results found for this basename: LABPROT,INR in the last 72 hours ABG    Component Value Date/Time   PHART 7.405* 02/25/2012 0401   HCO3 26.2* 02/25/2012 0401   TCO2 27.5 02/25/2012 0401   ACIDBASEDEF 1.1 02/22/2012 1344   O2SAT 99.2 02/25/2012 0401   CBG (last 3)  No results found for this basename: GLUCAP:3 in the last 72 hours  Assessment/Plan: S/P Procedure(s) (LRB): LEFT HEART CATHETERIZATION WITH CORONARY ANGIOGRAM (N/A)  2. CV- rate and pressure well controlled, on Metoprolol 3.  Constipation- resolved, patient having bowel movements without difficulty 4. Nausea- resolved 5. Dispo- patient doing much better.  She is tolerating a regular diet and has had no more episodes of nausea or vomiting.  She does question the need for Metoprolol and states she wishes not to take this unless absolutely necessary.  I explained to the patient that she has been hypertensive while in the hospital and that why it was prescribed.  I told her that she can hold the medication if she wishes and discuss the need with her Cardiologist.  She preferred to do that.  Will plan for d/c home today if okay with Dr. Dorris Fetch   LOS: 8 days    Alicia Huynh 03/03/2012   Patient seen and examined. Agree with above. D/c home today I recommended she go home on the beta blocker given that she did have some HTN and transient SVT- she is agreeable

## 2012-03-06 ENCOUNTER — Other Ambulatory Visit: Payer: Self-pay | Admitting: Thoracic Surgery (Cardiothoracic Vascular Surgery)

## 2012-03-06 DIAGNOSIS — D381 Neoplasm of uncertain behavior of trachea, bronchus and lung: Secondary | ICD-10-CM

## 2012-03-09 ENCOUNTER — Other Ambulatory Visit: Payer: Self-pay | Admitting: Physician Assistant

## 2012-03-09 ENCOUNTER — Other Ambulatory Visit: Payer: Self-pay | Admitting: *Deleted

## 2012-03-09 DIAGNOSIS — G8918 Other acute postprocedural pain: Secondary | ICD-10-CM

## 2012-03-09 MED ORDER — TRAMADOL HCL 50 MG PO TABS: 50.0000 mg | ORAL_TABLET | Freq: Four times a day (QID) | ORAL | Status: DC | PRN

## 2012-03-13 ENCOUNTER — Ambulatory Visit (INDEPENDENT_AMBULATORY_CARE_PROVIDER_SITE_OTHER): Payer: Self-pay | Admitting: Surgical

## 2012-03-13 ENCOUNTER — Ambulatory Visit
Admission: RE | Admit: 2012-03-13 | Discharge: 2012-03-13 | Disposition: A | Discharge status: Home / Self Care | Payer: Medicare FFS | Source: Ambulatory Visit | Attending: Thoracic Surgery (Cardiothoracic Vascular Surgery) | Admitting: Thoracic Surgery (Cardiothoracic Vascular Surgery)

## 2012-03-13 ENCOUNTER — Encounter: Payer: Medicare FFS | Admitting: Nurse Practitioner

## 2012-03-13 VITALS — BP 118/80 | HR 106 | Resp 17 | Ht 66.0 in | Wt 124.0 lb

## 2012-03-13 DIAGNOSIS — R918 Other nonspecific abnormal finding of lung field: Secondary | ICD-10-CM

## 2012-03-13 DIAGNOSIS — D491 Neoplasm of unspecified behavior of respiratory system: Secondary | ICD-10-CM

## 2012-03-13 DIAGNOSIS — D381 Neoplasm of uncertain behavior of trachea, bronchus and lung: Secondary | ICD-10-CM

## 2012-03-13 DIAGNOSIS — R222 Localized swelling, mass and lump, trunk: Secondary | ICD-10-CM

## 2012-03-13 NOTE — Patient Instructions (Signed)
Patient may drive, we did discuss protocol

## 2012-03-13 NOTE — Progress Notes (Signed)
PCP is Fredirick Maudlin, MD Referring Provider is Fredirick Maudlin, MD  Chief Complaint  Patient presents with  . Routine Post Op    2 wk f/u from rt vats & rt upper lobectomy    HPI: The patient is seen today in 2 week followup after her right upper lobectomy. 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 The nodes were negative for metastatic disease. She feels very good. She does have occasional nausea and has vomited on a couple occasions. She also has occasional dizziness. She denies fevers, chills or other constitutional symptoms. She is having no significant pain. Of note she did have a ST segment elevation myocardial infarction and was catheterized during the hospitalization. This was unremarkable for significant coronary artery disease. She sees Dr. Sw[place] next week.   Past Medical History  Diagnosis Date  . Hyperlipidemia     takes Pravastatin nightly  . COPD (chronic obstructive pulmonary disease)   . Left bundle branch block   . Tobacco abuse     50 pack years; one pack per day  . Lung mass     right  . Cancer     Past Surgical History  Procedure Date  . None   . Cholecystectomy 76yrs ago  . Heel spur surgery     spurs removed-bilateral  . Tubal ligation   . Breast biopsy     yrs ago and it was benign  . Pft      done 02/01/12    Family History  Problem Relation Age of Onset  . Lymphoma Mother     56  . Heart attack Father     47  . Heart attack Brother     40 -Bypass X3. another Bypass at [facility] failed  . Heart defect Brother     heart surgery  . Heart attack Daughter     stents X3  . Anesthesia problems Neg Hx   . Hypotension Neg Hx   . Malignant hyperthermia Neg Hx   . Pseudochol deficiency Neg Hx     Social History History  Substance Use Topics  . Smoking status: Former Smoker -- 1.0 packs/day for 50 years    Types: Cigarettes    Quit date: 02/03/2012  . Smokeless  tobacco: Never Used  . Alcohol Use: No    Current Outpatient Prescriptions  Medication Sig Dispense Refill  . aspirin 81 MG chewable tablet Chew 1 tablet (81 mg total) by mouth daily.      . pravastatin (PRAVACHOL) 40 MG tablet Take 40 mg by mouth daily.        Allergies  Allergen Reactions  . Codeine Shortness Of Breath    Review of Systems otherwise noncontributory  BP 118/80  Pulse 106  Resp 17  Ht 5\' 6"  (1.676 m)  Wt 124 lb (56.246 kg)  BMI 20.01 kg/m2  SpO2 97% Physical Exam general-well-developed adult female in no acute distress. Pulmonary-clear lung fields bilaterally Cardiac-regular rate and rhythm normal S1-S2, tachycardic rate approximately 105 Incision-healing well without evidence of infection Extremities-no edema Abdominal-soft nontender normal active bowel sounds   Diagnostic Tests: A chest x-ray was obtained on today's date. It reveals decreased in hilar fullness. The small right apical pneumothorax has resolved. No evidence of pneumonia or congestive heart failure.   Impression: Doing quite well. Discussed with patient tachycardia but she continues to not want to take metoprolol which she refused previously. She will however discussed with the  cardiologist at that appointment. She appears to need no further adjuvant therapies related to the carcinoma. She states she was told this by Dr. Dorris Fetch and her case was discussed in conference.   Plan: We will see her again in 3 weeks with a repeat chest x-ray. At that time she may be placed on a surveillance regimen.

## 2012-03-21 ENCOUNTER — Encounter: Payer: Self-pay | Admitting: Cardiology

## 2012-03-22 ENCOUNTER — Encounter: Payer: Self-pay | Admitting: Nurse Practitioner

## 2012-03-22 ENCOUNTER — Other Ambulatory Visit: Payer: Self-pay | Admitting: *Deleted

## 2012-03-22 ENCOUNTER — Ambulatory Visit (INDEPENDENT_AMBULATORY_CARE_PROVIDER_SITE_OTHER): Payer: Medicare FFS | Admitting: Nurse Practitioner

## 2012-03-22 VITALS — BP 140/92 | HR 72 | Ht 66.0 in | Wt 124.4 lb

## 2012-03-22 DIAGNOSIS — I214 Non-ST elevation (NSTEMI) myocardial infarction: Secondary | ICD-10-CM

## 2012-03-22 DIAGNOSIS — R911 Solitary pulmonary nodule: Secondary | ICD-10-CM

## 2012-03-22 DIAGNOSIS — R079 Chest pain, unspecified: Secondary | ICD-10-CM

## 2012-03-22 NOTE — Assessment & Plan Note (Signed)
This was felt to be due to post op stress/spasm. She has no heart muscle damage and only mild CAD. EF is normal. Risk factor modification is encouraged. She has already stopped smoking. Remains on statin that is followed by her primary care. She does not really feel she needs cardiology follow up and wishes to see her PCP closer to home. I think that is very reasonable. We will be available as needed. Patient is agreeable to this plan and will call if any problems develop in the interim.

## 2012-03-22 NOTE — Progress Notes (Signed)
Krystal Clark Date of Birth: [birth year] Medical Record #[id number]  History of Present Illness: Ms. Yingst is seen today for a post hospital visit. She is seen for Dr. Sw[place]. She has a newly diagnosed lung cancer - squamous cell and has had recent RUL lobectomy per Dr. Dorris Fetch. She had a NSTEMI post op. Cardiac cath showed well preserved LV function and only mild CAD. She will be managed medically. Her other problems include past tobacco abuse, HLD and COPD. She does have a chronic L BBB.   She comes in today. She is here with her husband. She has stopped smoking!. She has done very well since her discharge. She is back walking. No chest pain. Not short of breath. Some cough but not productive. No fever or chills. Was seen by the PA at [facility] and has further follow up later this month with Dr. Dorris Fetch. She is not to have any more treatment for her cancer according to her report.   Current Outpatient Prescriptions on File Prior to Visit  Medication Sig Dispense Refill  . aspirin 81 MG chewable tablet Chew 1 tablet (81 mg total) by mouth daily.      . pravastatin (PRAVACHOL) 40 MG tablet Take 40 mg by mouth daily.        Allergies  Allergen Reactions  . Codeine Shortness Of Breath    Past Medical History  Diagnosis Date  . Hyperlipidemia     takes Pravastatin nightly  . COPD (chronic obstructive pulmonary disease)   . Left bundle branch block   . Tobacco abuse     50 pack years; one pack per day  . Lung mass June 2013    right; s/p VATS with RUL lobectomy per Dr. Dorris Fetch  . 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 NSTEMI (non-ST elevated myocardial infarction) June 2013    following RUL lobectomy with cath showing well preserved LV function and mild CAD - MI felt to be due to post op stress/spasm.     Past Surgical History  Procedure Date  . None   . Cholecystectomy 47yrs ago  . Heel spur surgery     spurs removed-bilateral  . Tubal ligation   . Breast biopsy       yrs ago and it was benign  . Pft      done 02/01/12    History  Smoking status  . Former Smoker -- 1.0 packs/day for 50 years  . Types: Cigarettes  . Quit date: 02/03/2012  Smokeless tobacco  . Never Used    History  Alcohol Use No    Family History  Problem Relation Age of Onset  . Lymphoma Mother     96  . Heart attack Father     48  . Heart attack Brother     40 -Bypass X3. another Bypass at [facility] failed  . Heart defect Brother     heart surgery  . Heart attack Daughter     stents X3  . Anesthesia problems Neg Hx   . Hypotension Neg Hx   . Malignant hyperthermia Neg Hx   . Pseudochol deficiency Neg Hx     Review of Systems: The review of systems is per the HPI.  All other systems were reviewed and are negative.  Physical Exam: There were no vitals taken for this visit.  LABORATORY DATA:  Lab Results  Component Value Date   WBC 7.7 03/03/2012   HGB 11.6* 03/03/2012   HCT 34.1* 03/03/2012  PLT 246 03/03/2012   GLUCOSE 113* 03/02/2012   ALT 137* 02/27/2012   AST 69* 02/27/2012   NA 137 03/02/2012   K 3.6 03/02/2012   CL 101 03/02/2012   CREATININE 0.52 03/02/2012   BUN 9 03/02/2012   CO2 27 03/02/2012   INR 1.06 02/22/2012    Cardiac Cath Final Conclusions:   1. Nonobstructive coronary disease.  2. Normal left ventricular function.  Recommendations: I suspect this patient's findings cardiac enzymes related to postoperative stress/demand versus spasm. Recommend medical management.   Theron [facility]   Assessment / Plan:

## 2012-03-22 NOTE — Assessment & Plan Note (Signed)
She is doing well post op. Has stopped smoking. Further plan per Dr. Dorris Fetch.

## 2012-03-22 NOTE — Patient Instructions (Signed)
You look great from our standpoint  We will see you back as needed. Otherwise follow up with Dr. Juanetta Gosling  Call the [facility] office at [phone] if you have any questions, problems or concerns.

## 2012-04-07 ENCOUNTER — Other Ambulatory Visit: Payer: Self-pay | Admitting: Thoracic Surgery (Cardiothoracic Vascular Surgery)

## 2012-04-07 DIAGNOSIS — R222 Localized swelling, mass and lump, trunk: Secondary | ICD-10-CM

## 2012-04-11 ENCOUNTER — Ambulatory Visit
Admission: RE | Admit: 2012-04-11 | Discharge: 2012-04-11 | Disposition: A | Discharge status: Home / Self Care | Payer: Medicare FFS | Source: Ambulatory Visit | Attending: Thoracic Surgery (Cardiothoracic Vascular Surgery) | Admitting: Thoracic Surgery (Cardiothoracic Vascular Surgery)

## 2012-04-11 ENCOUNTER — Encounter: Payer: Self-pay | Admitting: Thoracic Surgery (Cardiothoracic Vascular Surgery)

## 2012-04-11 DIAGNOSIS — R222 Localized swelling, mass and lump, trunk: Secondary | ICD-10-CM

## 2012-04-14 ENCOUNTER — Ambulatory Visit (INDEPENDENT_AMBULATORY_CARE_PROVIDER_SITE_OTHER): Payer: Self-pay | Admitting: Thoracic Surgery (Cardiothoracic Vascular Surgery)

## 2012-04-14 ENCOUNTER — Encounter: Payer: Self-pay | Admitting: Thoracic Surgery (Cardiothoracic Vascular Surgery)

## 2012-04-14 VITALS — BP 133/83 | HR 74 | Resp 18 | Ht 66.0 in | Wt 128.0 lb

## 2012-04-14 DIAGNOSIS — C3490 Malignant neoplasm of unspecified part of unspecified bronchus or lung: Secondary | ICD-10-CM

## 2012-04-14 DIAGNOSIS — Z902 Acquired absence of lung [part of]: Secondary | ICD-10-CM

## 2012-04-14 DIAGNOSIS — Z9889 Other specified postprocedural states: Secondary | ICD-10-CM

## 2012-04-14 NOTE — Progress Notes (Signed)
  HPI:  Alicia Huynh returns for a scheduled postoperative followup visit. She is a 71 year old woman who had a 2.5 cm mass in her right upper lobe. She had a thoracoscopic right upper lobectomy on June 6. Postoperatively she did well. She says that she still having some pain near the incisions. [name] been using Tylenol for that. She does not want to take anything stronger than Tylenol. [name] not had any difficulty with her breathing.  Past Medical History  Diagnosis Date  . Hyperlipidemia     takes Pravastatin nightly  . COPD (chronic obstructive pulmonary disease)   . Left bundle branch block   . Tobacco abuse     50 pack years; one pack per day  . Lung mass June 2013    right; s/p VATS with RUL lobectomy per Dr. Dorris Fetch  . 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 NSTEMI (non-ST elevated myocardial infarction) June 2013    following RUL lobectomy with cath showing well preserved LV function and mild CAD - MI felt to be due to post op stress/spasm.       Current Outpatient Prescriptions  Medication Sig Dispense Refill  . aspirin 81 MG chewable tablet Chew 1 tablet (81 mg total) by mouth daily.      . pravastatin (PRAVACHOL) 40 MG tablet Take 40 mg by mouth daily.        Physical Exam BP 133/83  Pulse 74  Resp 18  Ht 5\' 6"  (1.676 m)  Wt 128 lb (58.06 kg)  BMI 20.66 kg/m2  SpO2 97% Well-developed well-nourished 71 year old woman in no acute distress Lungs are clear, slightly diminished right base Incision is well-healed Cardiac regular rate and rhythm normal S1 and S2 no rubs or gallops  Diagnostic Tests: Chest x-ray shows postoperative changes, no evidence recurrent disease  Impression: Alicia Huynh 71 year old woman who is now about 6 weeks out from thoracoscopic right upper lobectomy. This was a stage I lesion and did not require adjuvant chemotherapy or radiation. [name] doing very well at this point in time. She does still have some incisional discomfort, which is  not unusual this point and she continued to improve over time. [name] not requiring narcotics. She'll need to be followed up every 3-4 months for the first 2 years and then every 6 months out to 5 years to rule out recurrence.  Plan:  Return in 2 months with a chest x-ray.

## 2012-06-19 ENCOUNTER — Other Ambulatory Visit: Payer: Self-pay | Admitting: Thoracic Surgery (Cardiothoracic Vascular Surgery)

## 2012-06-19 DIAGNOSIS — D381 Neoplasm of uncertain behavior of trachea, bronchus and lung: Secondary | ICD-10-CM

## 2012-06-20 ENCOUNTER — Encounter: Payer: Self-pay | Admitting: Thoracic Surgery (Cardiothoracic Vascular Surgery)

## 2012-06-20 ENCOUNTER — Ambulatory Visit
Admission: RE | Admit: 2012-06-20 | Discharge: 2012-06-20 | Disposition: A | Discharge status: Home / Self Care | Payer: Medicare FFS | Source: Ambulatory Visit | Attending: Thoracic Surgery (Cardiothoracic Vascular Surgery) | Admitting: Thoracic Surgery (Cardiothoracic Vascular Surgery)

## 2012-06-20 ENCOUNTER — Ambulatory Visit (INDEPENDENT_AMBULATORY_CARE_PROVIDER_SITE_OTHER): Payer: Medicare FFS | Admitting: Thoracic Surgery (Cardiothoracic Vascular Surgery)

## 2012-06-20 VITALS — BP 122/73 | HR 66 | Resp 20 | Ht 66.0 in | Wt 135.0 lb

## 2012-06-20 DIAGNOSIS — C3490 Malignant neoplasm of unspecified part of unspecified bronchus or lung: Secondary | ICD-10-CM

## 2012-06-20 DIAGNOSIS — D381 Neoplasm of uncertain behavior of trachea, bronchus and lung: Secondary | ICD-10-CM

## 2012-06-20 DIAGNOSIS — Z902 Acquired absence of lung [part of]: Secondary | ICD-10-CM

## 2012-06-20 DIAGNOSIS — Z9889 Other specified postprocedural states: Secondary | ICD-10-CM

## 2012-06-20 NOTE — Patient Instructions (Signed)
Return in 3 months We will do a CT of the chest for that visit

## 2012-06-20 NOTE — Progress Notes (Signed)
  HPI:  Alicia Huynh returns today for a scheduled followup visit. She had a right upper lobectomy for a stage IA non-small cell carcinoma (squamous cell) on June 6. This was done with a VAT approach. She is feeling well. [name] not had any shortness of breath. She does to have some occasional discomfort around her incisions. Her weight has been stable. No unusual headaches or visual changes. All of systems negative.  Past Medical History  Diagnosis Date  . Hyperlipidemia     takes Pravastatin nightly  . COPD (chronic obstructive pulmonary disease)   . Left bundle branch block   . Tobacco abuse     50 pack years; one pack per day  . Lung mass June 2013    right; s/p VATS with RUL lobectomy per Dr. Dorris Fetch  . 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 NSTEMI (non-ST elevated myocardial infarction) June 2013    following RUL lobectomy with cath showing well preserved LV function and mild CAD - MI felt to be due to post op stress/spasm.       Current Outpatient Prescriptions  Medication Sig Dispense Refill  . aspirin 81 MG chewable tablet Chew 1 tablet (81 mg total) by mouth daily.      . pravastatin (PRAVACHOL) 40 MG tablet Take 40 mg by mouth daily.        Physical Exam BP 122/73  Pulse 66  Resp 20  Ht 5\' 6"  (1.676 m)  Wt 135 lb (61.236 kg)  BMI 21.79 kg/m2  SpO2 70% Gen. 71 year old woman in no acute distress No cervical, supraclavicular or epitrochlear adenopathy Lungs clear with equal breath sounds bilaterally Incision is well-healed  Diagnostic Tests: Chest x-ray 06/20/2012 shows postoperative changes on the right, no evidence recurrent disease  Impression: Alicia Huynh 71 year old woman now 3 months out from a thoracoscopic right upper lobectomy. She feels well and is progressing beautifully. She does still have some incisional discomfort, I reassured her that this is normal and should continue to improve over time. She has not had any issues related to her  breathing.   Plan: Return in 3 months for 6 month followup visit, we will do a CT of the chest at that time.

## 2012-06-22 ENCOUNTER — Other Ambulatory Visit (HOSPITAL_COMMUNITY): Payer: Self-pay | Admitting: Pulmonary Disease

## 2012-06-22 DIAGNOSIS — Z139 Encounter for screening, unspecified: Secondary | ICD-10-CM

## 2012-06-27 ENCOUNTER — Ambulatory Visit (HOSPITAL_COMMUNITY): Payer: Medicare FFS

## 2012-06-29 ENCOUNTER — Ambulatory Visit (HOSPITAL_COMMUNITY)
Admission: RE | Admit: 2012-06-29 | Discharge: 2012-06-29 | Disposition: A | Discharge status: Home / Self Care | Payer: Medicare FFS | Source: Ambulatory Visit | Attending: Pulmonary Disease | Admitting: Pulmonary Disease

## 2012-06-29 DIAGNOSIS — Z1231 Encounter for screening mammogram for malignant neoplasm of breast: Secondary | ICD-10-CM | POA: Insufficient documentation

## 2012-06-29 DIAGNOSIS — Z139 Encounter for screening, unspecified: Secondary | ICD-10-CM

## 2012-09-12 ENCOUNTER — Other Ambulatory Visit: Payer: Self-pay | Admitting: *Deleted

## 2012-09-12 DIAGNOSIS — C3490 Malignant neoplasm of unspecified part of unspecified bronchus or lung: Secondary | ICD-10-CM

## 2012-10-03 ENCOUNTER — Encounter: Payer: Self-pay | Admitting: Thoracic Surgery (Cardiothoracic Vascular Surgery)

## 2012-10-03 ENCOUNTER — Ambulatory Visit
Admission: RE | Admit: 2012-10-03 | Discharge: 2012-10-03 | Disposition: A | Discharge status: Home / Self Care | Payer: Medicare FFS | Source: Ambulatory Visit | Attending: Thoracic Surgery (Cardiothoracic Vascular Surgery) | Admitting: Thoracic Surgery (Cardiothoracic Vascular Surgery)

## 2012-10-03 ENCOUNTER — Ambulatory Visit (INDEPENDENT_AMBULATORY_CARE_PROVIDER_SITE_OTHER): Payer: Medicare FFS | Admitting: Thoracic Surgery (Cardiothoracic Vascular Surgery)

## 2012-10-03 VITALS — BP 134/84 | HR 69 | Resp 16 | Ht 66.0 in | Wt 141.5 lb

## 2012-10-03 DIAGNOSIS — C3490 Malignant neoplasm of unspecified part of unspecified bronchus or lung: Secondary | ICD-10-CM

## 2012-10-03 DIAGNOSIS — C3410 Malignant neoplasm of upper lobe, unspecified bronchus or lung: Secondary | ICD-10-CM

## 2012-10-03 DIAGNOSIS — Z09 Encounter for follow-up examination after completed treatment for conditions other than malignant neoplasm: Secondary | ICD-10-CM

## 2012-10-03 NOTE — Progress Notes (Signed)
  HPI:  Alicia Huynh returns today for a scheduled 6 month postoperative followup visit.She had a thoracoscopic right upper lobectomy for a stage IA non-small cell carcinoma (squamous cell) on February 24, 2012. In the interim since her last visit she's been doing well. She continues to have some occasional discomfort around her incisions, but says that it is improving. Her weight has been stable. She denies cough, shortness of breath, hemoptysis. No unusual headaches or visual changes. All other systems are negative.   Past Medical History  Diagnosis Date  . Hyperlipidemia     takes Pravastatin nightly  . COPD (chronic obstructive pulmonary disease)   . Left bundle branch block   . Tobacco abuse     50 pack years; one pack per day  . Lung mass June 2013    right; s/p VATS with RUL lobectomy per Dr. Dorris Fetch  . 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 NSTEMI (non-ST elevated myocardial infarction) June 2013    following RUL lobectomy with cath showing well preserved LV function and mild CAD - MI felt to be due to post op stress/spasm.      Current Outpatient Prescriptions  Medication Sig Dispense Refill  . aspirin 81 MG chewable tablet Chew 1 tablet (81 mg total) by mouth daily.      . pravastatin (PRAVACHOL) 40 MG tablet Take 40 mg by mouth daily.        Physical Exam BP 134/84  Pulse 69  Resp 16  Ht 5\' 6"  (1.676 m)  Wt 141 lb 8 oz (64.184 kg)  BMI 22.84 kg/m2  SpO46 75% 72 year old woman in no acute distress Developed well-nourished Neck no cervical or supraclavicular adenopathy Lungs clear with equal breath sounds bilaterally Incisions well healed Cardiac regular rate and rhythm normal S1 and S2  Diagnostic Tests: CT 10/03/2012  CT CHEST WITHOUT CONTRAST  Technique: Multidetector CT imaging of the chest was performed  following the standard protocol without IV contrast.  Comparison: Multiple exams, including 01/27/2012  Findings: Atherosclerosis noted. Prior right  upper lobectomy  observed. No recurrent lung mass. No pathologic thoracic  adenopathy observed. A hypodense right mid kidney posterior lesion  measuring 2.1 x 1.6 cm. Photopenic on prior PET CT and is probably  a cyst. Internal density 13 HU. No adrenal mass observed.  IMPRESSION:  1. Postoperative findings in the right lung. No recurrence or  metastatic disease observed.  2. Hypodense lesion in the right kidney, probably a cyst. This is  not metabolically active on prior PET CT.     Impression: 72 year old woman now 6 months out from a thoracoscopic right upper lobectomy. She had a stage IA squamous cell carcinoma. She has no evidence of recurrent disease.   Plan: Return in 3 months with chest x-ray PA and lateral  She knows to call anytime if we can be of any assistance in the interim

## 2012-10-03 NOTE — Patient Instructions (Signed)
Return in 3 months.

## 2012-12-22 ENCOUNTER — Other Ambulatory Visit: Payer: Self-pay | Admitting: *Deleted

## 2012-12-22 DIAGNOSIS — C3490 Malignant neoplasm of unspecified part of unspecified bronchus or lung: Secondary | ICD-10-CM

## 2012-12-26 ENCOUNTER — Ambulatory Visit
Admission: RE | Admit: 2012-12-26 | Discharge: 2012-12-26 | Disposition: A | Discharge status: Home / Self Care | Payer: Medicare FFS | Source: Ambulatory Visit | Attending: Thoracic Surgery (Cardiothoracic Vascular Surgery) | Admitting: Thoracic Surgery (Cardiothoracic Vascular Surgery)

## 2012-12-26 ENCOUNTER — Encounter: Payer: Self-pay | Admitting: Thoracic Surgery (Cardiothoracic Vascular Surgery)

## 2012-12-26 ENCOUNTER — Ambulatory Visit (INDEPENDENT_AMBULATORY_CARE_PROVIDER_SITE_OTHER): Payer: Medicare FFS | Admitting: Thoracic Surgery (Cardiothoracic Vascular Surgery)

## 2012-12-26 VITALS — BP 140/76 | HR 68 | Resp 20 | Ht 66.0 in | Wt 149.0 lb

## 2012-12-26 DIAGNOSIS — C3490 Malignant neoplasm of unspecified part of unspecified bronchus or lung: Secondary | ICD-10-CM

## 2012-12-26 DIAGNOSIS — Z9889 Other specified postprocedural states: Secondary | ICD-10-CM

## 2012-12-26 DIAGNOSIS — Z85118 Personal history of other malignant neoplasm of bronchus and lung: Secondary | ICD-10-CM

## 2012-12-26 DIAGNOSIS — Z09 Encounter for follow-up examination after completed treatment for conditions other than malignant neoplasm: Secondary | ICD-10-CM

## 2012-12-26 DIAGNOSIS — Z902 Acquired absence of lung [part of]: Secondary | ICD-10-CM

## 2012-12-26 NOTE — Progress Notes (Signed)
  HPI:  Alicia Huynh returns today for a 3 month interval followup visit. She had a right upper lobectomy for stage IA non-small cell carcinoma back in June of 2013. She says that she is feeling well. She denies any shortness of breath. Her weight is stable. [name] not had any new headaches or visual changes or any new or unusual bone or joint pain. She does still have some occasional discomfort related to her incisions, but is not having to take anything for that. Overall she feels well and is exercising regularly. She says she has gained a few pounds since her last visit.  Past Medical History  Diagnosis Date  . Hyperlipidemia     takes Pravastatin nightly  . COPD (chronic obstructive pulmonary disease)   . Left bundle branch block   . Tobacco abuse     50 pack years; one pack per day  . Lung mass June 2013    right; s/p VATS with RUL lobectomy per Dr. Dorris Fetch  . 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 NSTEMI (non-ST elevated myocardial infarction) June 2013    following RUL lobectomy with cath showing well preserved LV function and mild CAD - MI felt to be due to post op stress/spasm.       Current Outpatient Prescriptions  Medication Sig Dispense Refill  . aspirin 81 MG chewable tablet Chew 1 tablet (81 mg total) by mouth daily.      . pravastatin (PRAVACHOL) 40 MG tablet Take 40 mg by mouth daily.       No current facility-administered medications for this visit.    Physical Exam BP 140/76  Pulse 68  Resp 20  Ht 5\' 6"  (1.676 m)  Wt 149 lb (67.586 kg)  BMI 24.06 kg/m2  SpO5 49% A 72 year old woman in no acute distress Neurologic alert and oriented x3 with no deficits Lungs clear with equal breath sounds bilaterally Cardiac regular rate and rhythm normal S1 and S2 No cervical or supraclavicular adenopathy  Diagnostic Tests: Chest x-ray 12/26/12 CHEST - 2 VIEW  Comparison: CT chest of 10/03/2012 and chest x-ray of 06/20/2012  Findings: Postoperative changes in the  right hemithorax are stable  after right upper lobectomy. Surgical clips overlie the right  hilum and there is volume loss with some elevation of the right  hemidiaphragm. The lungs are clear and well aerated. No effusion  is seen. Mediastinal contours are stable. The heart is unchanged  in size. No bony abnormality is seen.  IMPRESSION:  Stable postop change in the right hemithorax after right upper  lobectomy. No active process. No effusion.  Original Report Authenticated By: Dwyane Dee, M.D.   Impression: 72 year old woman now 9 months out from resection for a stage IA non-small cell carcinoma. She is doing extremely well at this point in time with no evidence of recurrent disease.  Plan: Return in 3 months for her one-year followup visit.  CT of chest at 1 year followup

## 2013-03-07 ENCOUNTER — Other Ambulatory Visit: Payer: Self-pay

## 2013-03-07 DIAGNOSIS — D381 Neoplasm of uncertain behavior of trachea, bronchus and lung: Secondary | ICD-10-CM

## 2013-04-03 ENCOUNTER — Ambulatory Visit (INDEPENDENT_AMBULATORY_CARE_PROVIDER_SITE_OTHER): Payer: Medicare FFS | Admitting: Thoracic Surgery (Cardiothoracic Vascular Surgery)

## 2013-04-03 ENCOUNTER — Encounter: Payer: Self-pay | Admitting: Thoracic Surgery (Cardiothoracic Vascular Surgery)

## 2013-04-03 ENCOUNTER — Ambulatory Visit
Admission: RE | Admit: 2013-04-03 | Discharge: 2013-04-03 | Disposition: A | Discharge status: Home / Self Care | Payer: Medicare FFS | Source: Ambulatory Visit | Attending: Thoracic Surgery (Cardiothoracic Vascular Surgery) | Admitting: Thoracic Surgery (Cardiothoracic Vascular Surgery)

## 2013-04-03 VITALS — BP 129/79 | HR 67 | Resp 18 | Ht 66.0 in | Wt 149.0 lb

## 2013-04-03 DIAGNOSIS — D381 Neoplasm of uncertain behavior of trachea, bronchus and lung: Secondary | ICD-10-CM

## 2013-04-03 DIAGNOSIS — Z9889 Other specified postprocedural states: Secondary | ICD-10-CM

## 2013-04-03 DIAGNOSIS — Z85118 Personal history of other malignant neoplasm of bronchus and lung: Secondary | ICD-10-CM

## 2013-04-03 DIAGNOSIS — Z902 Acquired absence of lung [part of]: Secondary | ICD-10-CM

## 2013-04-03 NOTE — Progress Notes (Signed)
HPI:  Alicia Huynh returns today for her one-year followup visit. She had a right upper lobectomy in June of 2013 for stage IA non-small cell carcinoma. She was last in the office in April of which time she was doing well with no evidence recurrent disease.  Since her last visit she says she has continued to do well. Yesterday she her husband and cut 5 acres of grass. She did some of this on a riding mower and some pushing a mower. [name] not having any chest pain or shortness of breath. She has an occasional dry cough. She hemoptysis. Her weight has been stable. Her exercise tolerance is good. Overall she feels well and has no complaints.  Past Medical History  Diagnosis Date  . Hyperlipidemia     takes Pravastatin nightly  . COPD (chronic obstructive pulmonary disease)   . Left bundle branch block   . Tobacco abuse     50 pack years; one pack per day  . Lung mass June 2013    right; s/p VATS with RUL lobectomy per Dr. Dorris Fetch  . 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 NSTEMI (non-ST elevated myocardial infarction) June 2013    following RUL lobectomy with cath showing well preserved LV function and mild CAD - MI felt to be due to post op stress/spasm.       Current Outpatient Prescriptions  Medication Sig Dispense Refill  . pravastatin (PRAVACHOL) 40 MG tablet Take 40 mg by mouth daily.       No current facility-administered medications for this visit.    Review of systems  See history of present illness   Physical Exam BP 129/79  Pulse 67  Resp 18  Ht 5\' 6"  (1.676 m)  Wt 149 lb (67.586 kg)  BMI 24.06 kg/m2  SpO92 74% 72 year old woman in no acute distress General well-developed well-nourished Neurologic alert and oriented x3 with no deficits Lungs clear with equal breath sounds bilaterally Cardiac regular rate and rhythm normal S1 and S2 No cervical, supraclavicular, or epitrochlear adenopathy.  Diagnostic Tests: CT of chest 04/03/2013 *RADIOLOGY REPORT*   Clinical Data: History of right upper lobectomy for lung carcinoma,  follow-up  CT CHEST WITHOUT CONTRAST  Technique: Multidetector CT imaging of the chest was performed  following the standard protocol without IV contrast.  Comparison: CT chest of 10/03/2012 and chest x-ray of 12/26/2012  Findings: Changes of prior right upper lobectomy are appear stable.  No evidence of recurrence of lung carcinoma is seen. Surgical  sutures and clips overlie the right hilum. The central airway is  patent. No lung nodule or effusion is seen, with minimal linear  scarring in the right middle lobe and right lower lobe  peripherally.  On soft tissue window images, on this unenhanced study, no  mediastinal or hilar adenopathy is seen. The heart is borderline  enlarged and stable. No abnormality of the upper abdomen is seen  other than cysts in the upper pole of the right kidney as noted  previously. No bony abnormality is noted.  IMPRESSION:  Stable postop changes of right upper lobectomy. No evidence of  recurrence of lung carcinoma.  Original Report Authenticated By: Dwyane Dee, M.D.   Impression: 72 year old woman now one year out from a lobectomy for stage IA non-small cell carcinoma. She has no evidence recurrent disease.  [name] doing extremely well clinically.   Plan:  I will see her back in 3 months with a PA and lateral chest x-ray at that time

## 2013-06-18 ENCOUNTER — Other Ambulatory Visit: Payer: Self-pay | Admitting: *Deleted

## 2013-06-19 ENCOUNTER — Encounter: Payer: Commercial Managed Care - HMO | Admitting: Thoracic Surgery (Cardiothoracic Vascular Surgery)

## 2013-06-19 ENCOUNTER — Other Ambulatory Visit: Payer: Self-pay | Admitting: *Deleted

## 2013-06-21 ENCOUNTER — Ambulatory Visit
Admission: RE | Admit: 2013-06-21 | Discharge: 2013-06-21 | Disposition: A | Discharge status: Home / Self Care | Payer: Commercial Managed Care - HMO | Source: Ambulatory Visit | Attending: Thoracic Surgery (Cardiothoracic Vascular Surgery) | Admitting: Thoracic Surgery (Cardiothoracic Vascular Surgery)

## 2013-06-21 ENCOUNTER — Ambulatory Visit (INDEPENDENT_AMBULATORY_CARE_PROVIDER_SITE_OTHER): Payer: Medicare FFS | Admitting: Thoracic Surgery (Cardiothoracic Vascular Surgery)

## 2013-06-21 ENCOUNTER — Encounter: Payer: Self-pay | Admitting: Thoracic Surgery (Cardiothoracic Vascular Surgery)

## 2013-06-21 VITALS — BP 128/77 | HR 90 | Resp 20 | Ht 66.0 in | Wt 149.0 lb

## 2013-06-21 DIAGNOSIS — B029 Zoster without complications: Secondary | ICD-10-CM

## 2013-06-21 DIAGNOSIS — Z9889 Other specified postprocedural states: Secondary | ICD-10-CM

## 2013-06-21 DIAGNOSIS — Z902 Acquired absence of lung [part of]: Secondary | ICD-10-CM

## 2013-06-21 DIAGNOSIS — Z85118 Personal history of other malignant neoplasm of bronchus and lung: Secondary | ICD-10-CM

## 2013-06-21 MED ORDER — TRAMADOL HCL 50 MG PO TABS: ORAL_TABLET | ORAL | Status: DC

## 2013-06-21 MED ORDER — VALACYCLOVIR HCL 1 G PO TABS: 1000.0000 mg | ORAL_TABLET | Freq: Three times a day (TID) | ORAL | Status: DC

## 2013-06-21 NOTE — Progress Notes (Signed)
HPI:  Alicia Huynh returns today for a scheduled followup visit. She is a 72 year old woman who had a thoracoscopic right upper lobectomy in June of 2013 for stage IA non-small cell lung cancer. He was last seen in the office in July for her one-year followup visit was doing well at that point in time.  In the interim since her last visit she was doing well until two days ago. She developed right-sided chest pain. She describes as is very severe, worse than the pain she had from surgery. She is very concerned this could be a recurrence. She has been taking Ultram 1 tablet every 4 hours for the pain as Tylenol and Advil have been ineffective.  Her weight has been stable. She has not had any shortness of breath. She had any unusual headaches or visual changes.  Past Medical History  Diagnosis Date  . Hyperlipidemia     takes Pravastatin nightly  . COPD (chronic obstructive pulmonary disease)   . Left bundle branch block   . Tobacco abuse     50 pack years; one pack per day  . Lung mass June 2013    right; s/p VATS with RUL lobectomy per Dr. Dorris Fetch  . 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 NSTEMI (non-ST elevated myocardial infarction) June 2013    following RUL lobectomy with cath showing well preserved LV function and mild CAD - MI felt to be due to post op stress/spasm.     Current Outpatient Prescriptions  Medication Sig Dispense Refill  . pravastatin (PRAVACHOL) 40 MG tablet Take 40 mg by mouth daily.      . traMADol (ULTRAM) 50 MG tablet Take 50 mg by mouth every 6 (six) hours as needed for pain.      . traMADol (ULTRAM) 50 MG tablet Take 1 to 2 tablets every 6 hours as needed for pain  40 tablet  0  . valACYclovir (VALTREX) 1000 MG tablet Take 1 tablet (1,000 mg total) by mouth 3 (three) times daily.  30 tablet  0   No current facility-administered medications for this visit.    Physical Exam BP 128/77  Pulse 90  Resp 20  Ht 5\' 6"  (1.676 m)  Wt 149 lb (67.586 kg)   BMI 24.06 kg/m2  SpO71 79% 72 year old woman in no acute distress Neurologic alert and oriented x3 with no focal deficits Lungs clear with equal breath sounds bilaterally Cardiac regular rate and rhythm Incisions well healed Maculopapular rash in a dermatomal distribution on the right chest  Diagnostic Tests: Chest x-ray 06/21/13 EXAM:  CHEST 2 VIEW  COMPARISON: 12/26/2012  FINDINGS:  Cardiac shadow is stable. The left lung is well aerated without  focal abnormality. Postsurgical changes are noted on the right with  some mild volume loss. No focal infiltrate or parenchymal nodule is  seen. The overall appearance is stable from the prior study.  IMPRESSION:  Postoperative changes on the right without acute abnormality.  Electronically Signed  By: Alcide Clever  On: 06/21/2013 10:37  Impression: 72 year old woman who is now one year and 3 months out from a thoracoscopic right upper lobectomy. She has no evidence recurrent disease.  She has a rash and a dermatomal distribution consistent with shingles. She is upset by this because she had the shingles vaccine a couple of years ago. I put in a prescription for her for Valtrex thousand milligrams by mouth 3 times a day for 10 days. Also gave her a prescription for Ultram 50 mg tablets,  one to 2 tablets by mouth every 6 hours when necessary for pain, dispense 40 tablets, no refills. Device her that if this rash and pain and not improved in 10 days she should see Dr. Juanetta Gosling.  Plan: I will plan to see her back in 3 months we'll do a CT of the chest at that time.

## 2013-07-03 ENCOUNTER — Ambulatory Visit: Payer: Medicare FFS | Admitting: Thoracic Surgery (Cardiothoracic Vascular Surgery)

## 2013-08-24 ENCOUNTER — Other Ambulatory Visit (HOSPITAL_COMMUNITY): Payer: Self-pay | Admitting: Pulmonary Disease

## 2013-08-24 DIAGNOSIS — Z139 Encounter for screening, unspecified: Secondary | ICD-10-CM

## 2013-08-30 ENCOUNTER — Ambulatory Visit (HOSPITAL_COMMUNITY)
Admission: RE | Admit: 2013-08-30 | Discharge: 2013-08-30 | Disposition: A | Discharge status: Home / Self Care | Payer: Medicare FFS | Source: Ambulatory Visit | Attending: Pulmonary Disease | Admitting: Pulmonary Disease

## 2013-08-30 DIAGNOSIS — Z1231 Encounter for screening mammogram for malignant neoplasm of breast: Secondary | ICD-10-CM | POA: Insufficient documentation

## 2013-08-30 DIAGNOSIS — Z139 Encounter for screening, unspecified: Secondary | ICD-10-CM

## 2013-09-24 ENCOUNTER — Other Ambulatory Visit: Payer: Self-pay

## 2013-09-24 DIAGNOSIS — C3490 Malignant neoplasm of unspecified part of unspecified bronchus or lung: Secondary | ICD-10-CM

## 2013-10-16 ENCOUNTER — Ambulatory Visit (INDEPENDENT_AMBULATORY_CARE_PROVIDER_SITE_OTHER): Payer: Medicare FFS | Admitting: Thoracic Surgery (Cardiothoracic Vascular Surgery)

## 2013-10-16 ENCOUNTER — Ambulatory Visit
Admission: RE | Admit: 2013-10-16 | Discharge: 2013-10-16 | Disposition: A | Discharge status: Home / Self Care | Payer: Commercial Managed Care - HMO | Source: Ambulatory Visit | Attending: Thoracic Surgery (Cardiothoracic Vascular Surgery) | Admitting: Thoracic Surgery (Cardiothoracic Vascular Surgery)

## 2013-10-16 ENCOUNTER — Encounter: Payer: Self-pay | Admitting: Thoracic Surgery (Cardiothoracic Vascular Surgery)

## 2013-10-16 VITALS — BP 140/86 | HR 68 | Resp 20 | Ht 66.0 in | Wt 153.0 lb

## 2013-10-16 DIAGNOSIS — Z85118 Personal history of other malignant neoplasm of bronchus and lung: Secondary | ICD-10-CM

## 2013-10-16 DIAGNOSIS — Z902 Acquired absence of lung [part of]: Secondary | ICD-10-CM

## 2013-10-16 DIAGNOSIS — Z9889 Other specified postprocedural states: Secondary | ICD-10-CM

## 2013-10-16 DIAGNOSIS — C3490 Malignant neoplasm of unspecified part of unspecified bronchus or lung: Secondary | ICD-10-CM

## 2013-10-16 NOTE — Progress Notes (Signed)
  HPI:  Alicia Huynh returns for a scheduled followup visit. She is a 73 year old woman who had a thoracoscopic right upper lobectomy for a stage IA non-small cell lung cancer in June of 2013. We have been following her on a regular basis since then. Her last office visit was in October 2014. That time she was having some right-sided chest pain and was worried that she had a recurrence. Fortunately that did not turn out to be the case.  In the interim since her last visit she's been doing well. [name] not had any problems with her breathing. She denies any weight loss and says she thinks she's gained a few pounds. She has had an occasional cough, but no hemoptysis. She denies any chest pain or shortness of breath. She is able to about her activities without any limitations.  Past Medical History  Diagnosis Date  . Hyperlipidemia     takes Pravastatin nightly  . COPD (chronic obstructive pulmonary disease)   . Left bundle branch block   . Tobacco abuse     50 pack years; one pack per day  . Lung mass June 2013    right; s/p VATS with RUL lobectomy per Dr. Roxan Hockey  . 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 NSTEMI (non-ST elevated myocardial infarction) June 2013    following RUL lobectomy with cath showing well preserved LV function and mild CAD - MI felt to be due to post op stress/spasm.       Current Outpatient Prescriptions  Medication Sig Dispense Refill  . pravastatin (PRAVACHOL) 40 MG tablet Take 40 mg by mouth daily.       No current facility-administered medications for this visit.    Physical Exam BP 140/86  Pulse 68  Resp 20  Ht 5\' 6"  (1.676 m)  Wt 153 lb (69.4 kg)  BMI 24.71 kg/m2  SpO54 34% 73 year old woman in no acute distress Well-developed well-nourished Alert and oriented x3 with no focal deficits Regular rate and rhythm normal S1 and S2 Lungs clear with essentially equal breath sounds bilaterally, no rales or wheezing No cervical or supraclavicular  adenopathy  Diagnostic Tests: CT CHEST WITHOUT CONTRAST  TECHNIQUE:  Multidetector CT imaging of the chest was performed following the  standard protocol without IV contrast.  COMPARISON: 04/03/2013  FINDINGS:  2 mm pulmonary nodule superior segment right lower lobe image number  22 series 4, stable from 04/03/2013. New postsurgical change right  hilum and medial surface of right middle lobe, stable. Mild scarring  right base, stable. Left lung is clear.  No significant hilar or mediastinal adenopathy. No pleural  effusions. Stable aortic and coronary arterial calcification.  No adrenal masses. Partially visualized right renal cysts, stable.  No acute musculoskeletal findings.  IMPRESSION:  Postsurgical changes with no acute findings. Stable 2 mm right  pulmonary nodule.  Electronically Signed  By: Skipper Cliche M.D.  On: 10/16/2013 14:01    Impression: 73 year old woman who is now 18 months post thoracoscopic right upper lobectomy for a stage IA non-small cell carcinoma. She is no evidence recurrent disease.  At this point I think we can go to every 6 month followup. I will plan to see her in 6 months with a CT for her 2 year followup visit.

## 2013-11-01 IMAGING — PT NM PET TUM IMG INITIAL (PI) SKULL BASE T - THIGH
6 series · 25 of 25 positions shown · non-contrast
Comparison: CT chest 01/18/2012.

CLINICAL DATA: Recent imaging demonstrates a solitary pulmonary
nodule.  FDG PET CT requested to evaluate for possible malignancy.

NUCLEAR MEDICINE PET SKULL BASE TO THIGH
Fasting Blood Glucose:  98
TECHNIQUE: 17.5 mCi F-18 FDG was injected intravenously via the
left antecubital fossa.  CT data was obtained and used for
attenuation correction and anatomic localization only.  (This was
not acquired as a diagnostic CT examination.) Additional exam
technical data entered on technologist worksheet.

[Series 1: pet ac · axial · 3.3mm · 4.69mm/px · z∈[-870,+0]mm · 5 of 267 slices shown]
[im 1/267]
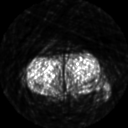
[im 67/267]
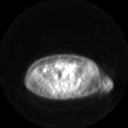
[im 134/267]
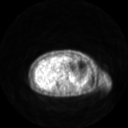
[im 200/267]
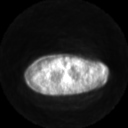
[im 267/267]
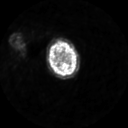

[Series 2: ct images · axial · 3.8mm · 0.98mm/px · z∈[-870,+0]mm · 6 of 267 slices shown]
[im 1/267]
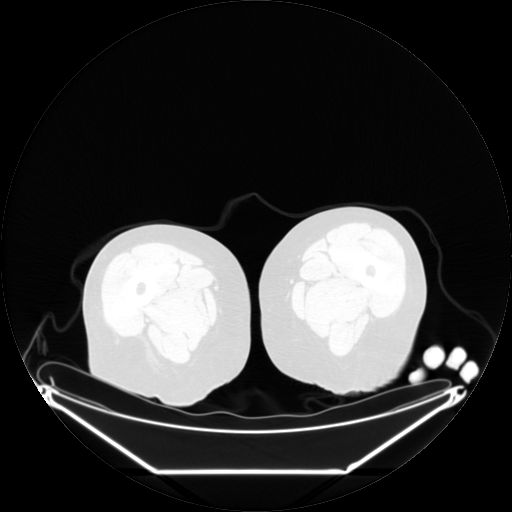
[im 54/267]
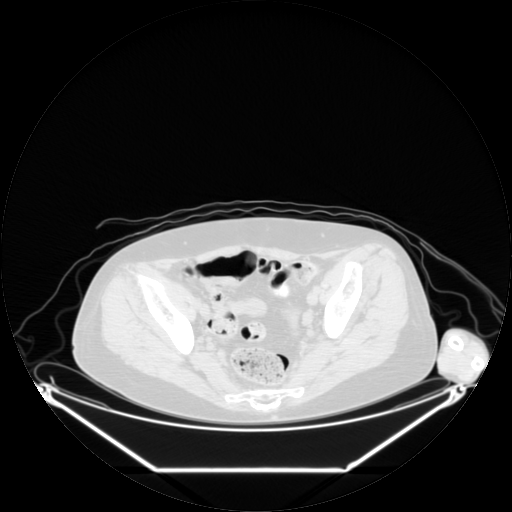
[im 107/267]
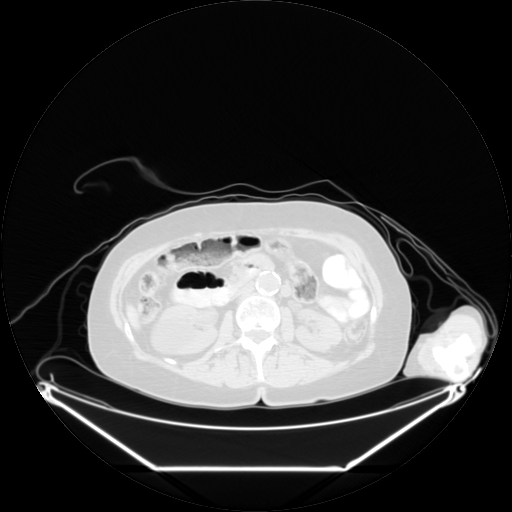
[im 160/267]
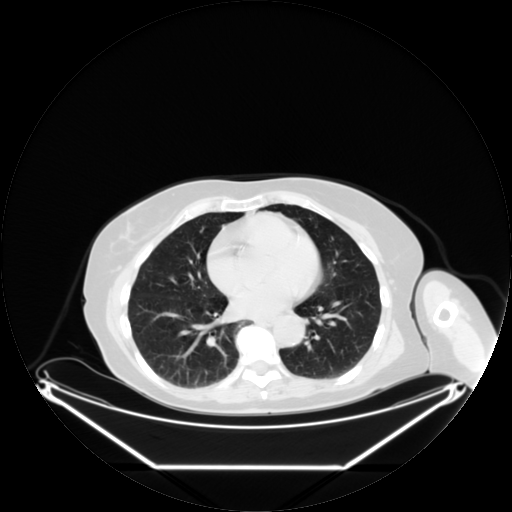
[im 213/267]
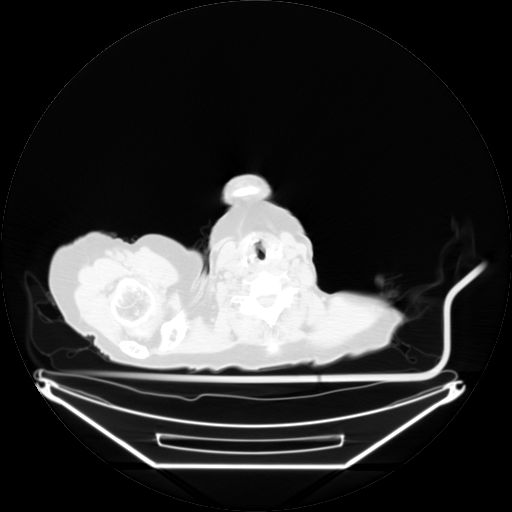
[im 267/267  brain]
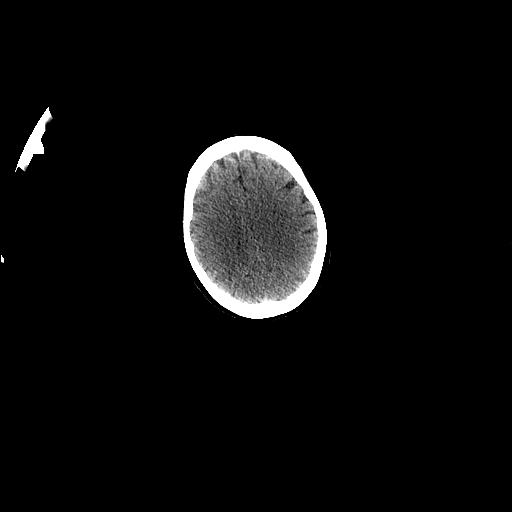

[Series 2: pet nac · axial · 3.3mm · 4.69mm/px · z∈[-870,+0]mm · 6 of 267 slices shown]
[im 1/267]
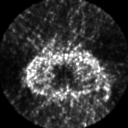
[im 54/267]
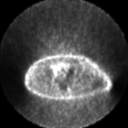
[im 107/267]
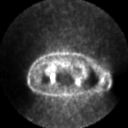
[im 160/267]
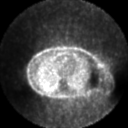
[im 213/267]
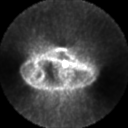
[im 267/267]
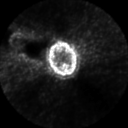

[Series 123: mip · coronal · 3.3mm · 4.69mm/px · 1 of 30 slices shown]
[im 1/30]
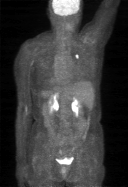

[Series 151: reformatted · axial · 3.3mm · 3.91mm/px · z∈[-870,+0]mm · 6 of 265 slices shown (1 of 2)]
[im 1/265]
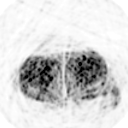
[im 53/265]
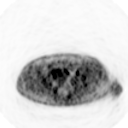
[im 106/265]
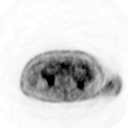
[im 159/265]
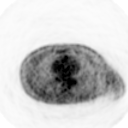
[im 212/265]
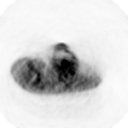
[im 265/265]
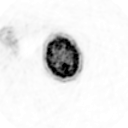

[Series 153: reformatted · coronal · 4.7mm · 6.98mm/px · 1 of 60 slices shown (2 of 2)]
[im 1/60]
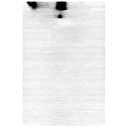

[25 of 25 positions shown; findings below may reference images not displayed]

FINDINGS: Head/Neck:  No hypermetabolic lymph nodes in the neck. CT images
show no acute findings.

Chest:  No hypermetabolic mediastinal or hilar adenopathy.  A
microlobulated nodule in the right upper lobe measures 1.7 x 1.2 cm
and has an S U V max of 13.1.  No additional areas of abnormal
hypermetabolism in the chest.  CT images show no acute findings.
No pericardial or pleural effusion.

Abdomen/Pelvis:  No abnormal hypermetabolic activity within the
liver, pancreas, adrenal glands, or spleen.  No hypermetabolic
lymph nodes in the abdomen or pelvis. CT images show no acute
findings.  Low attenuation lesions in the kidneys measure up to 5
cm on the right.  Atherosclerotic calcification of the arterial
vasculature with an infrarenal aortic aneurysm, measuring 2.8 x
cm.  No free fluid.

Skeleton:  No focal hypermetabolic activity to suggest skeletal
metastasis.
IMPRESSION: Hypermetabolic right upper lobe nodule is most consistent with
primary bronchogenic carcinoma.  Imaging findings are indicative of
stage IA disease.

## 2013-11-12 IMAGING — CR DG CHEST 1V
1 series · 1 of 1 positions shown · non-contrast
Comparison: Chest CT 02/07/2012.

CLINICAL DATA: Right lung biopsy.

CHEST - 1 VIEW

[view not recorded]
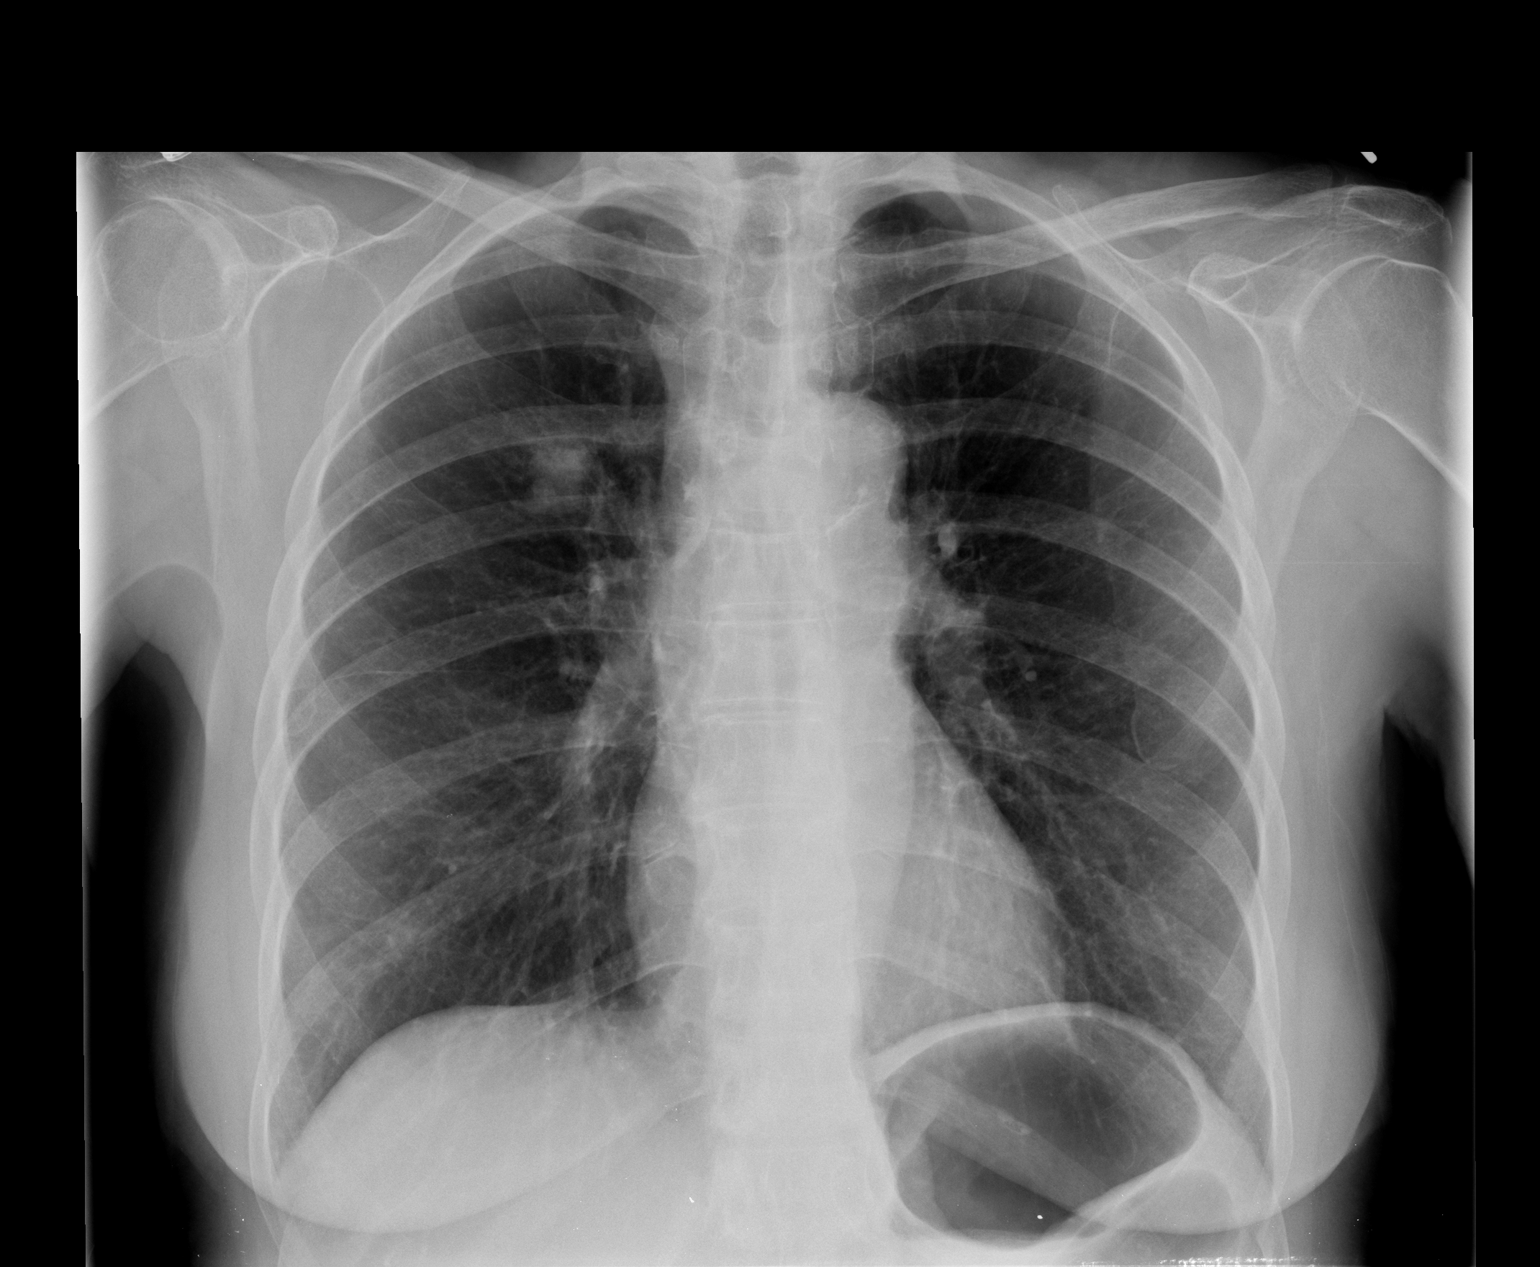

[1 of 1 positions shown; findings below may reference images not displayed]

FINDINGS: No postprocedural pneumothorax is identified.  The lungs
are clear.  Stable appearance of the right lung nodule without
evidence for significant pulmonary hemorrhage.
IMPRESSION: No postprocedural pneumothorax or evidence of significant pulmonary
hemorrhage.

## 2013-11-12 IMAGING — CT CT BIOPSY
1 series · 15 of 21 positions shown, 19 images · non-contrast
Comparison: none

CLINICAL DATA: Hypermetabolic right upper lobe lung nodule.  No
evidence of metastatic disease on PET-CT.

[Series 2: localization · axial · 0.44mm/px · z∈[-118,-28]mm · 15 of 21 slices shown, 19 images]
[im 2/21  mediastinal]
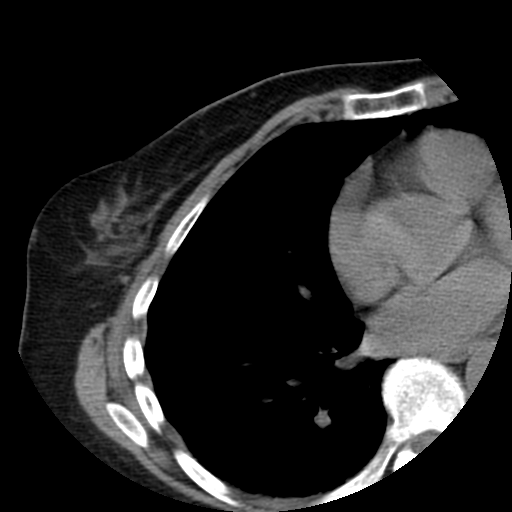
[im 2/21  lung]
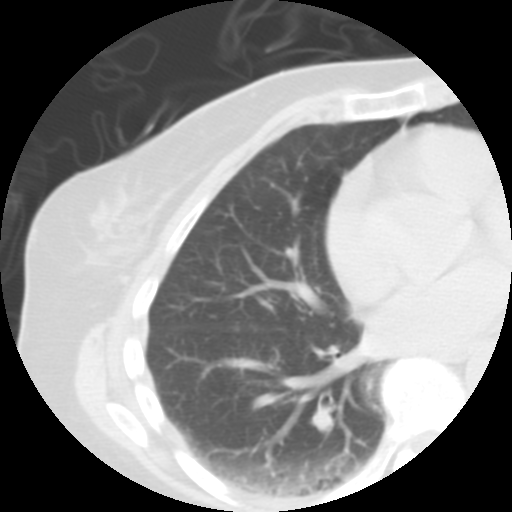
[im 3/21  lung]
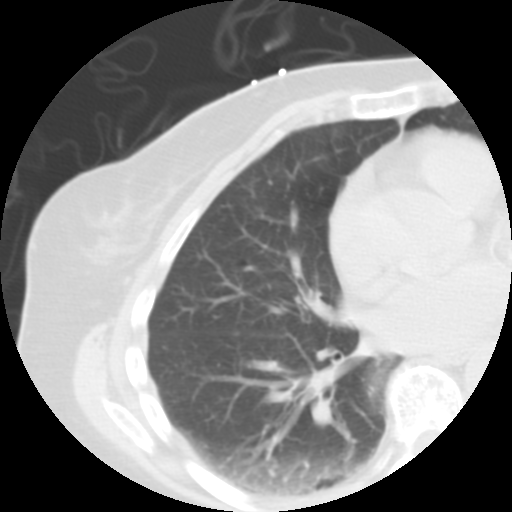
[im 5/21  lung]
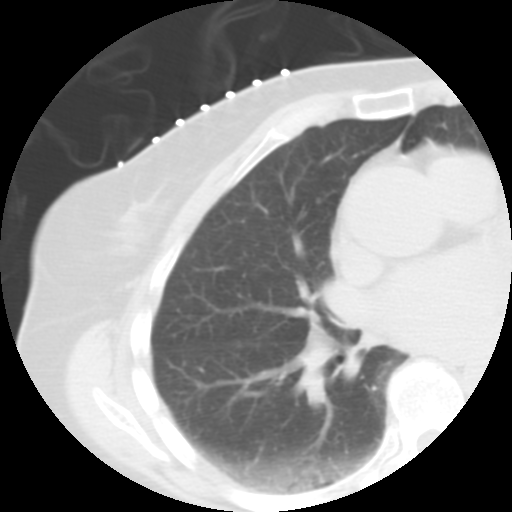
[im 6/21  lung]
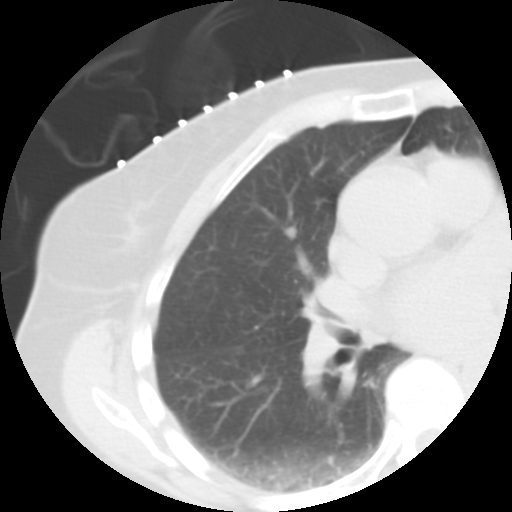
[im 7/21  mediastinal]
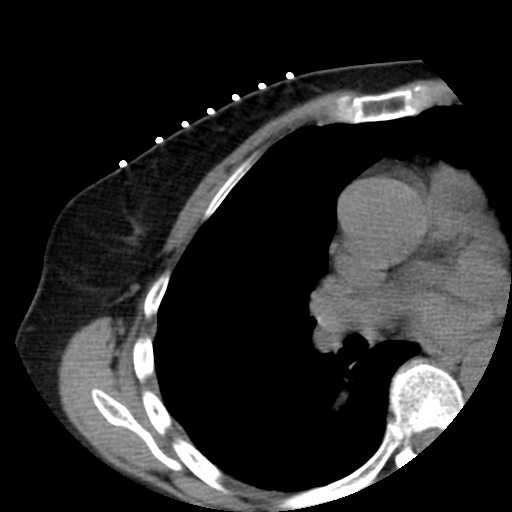
[im 7/21  lung]
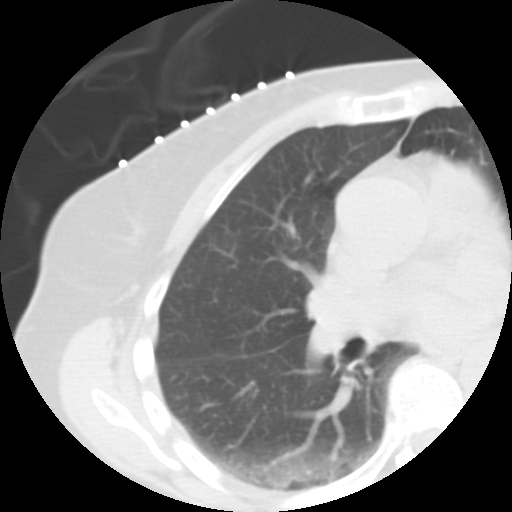
[im 8/21  lung]
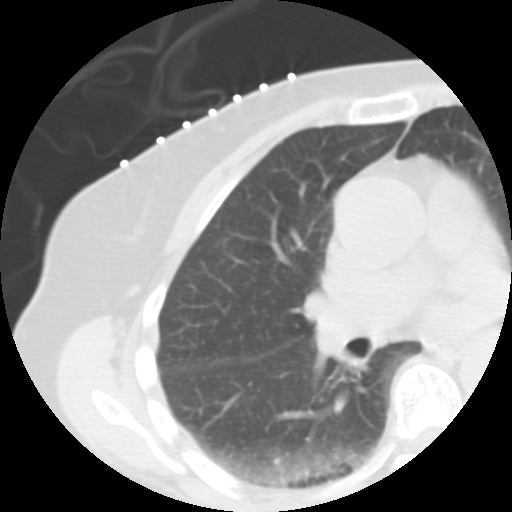
[im 10/21  lung]
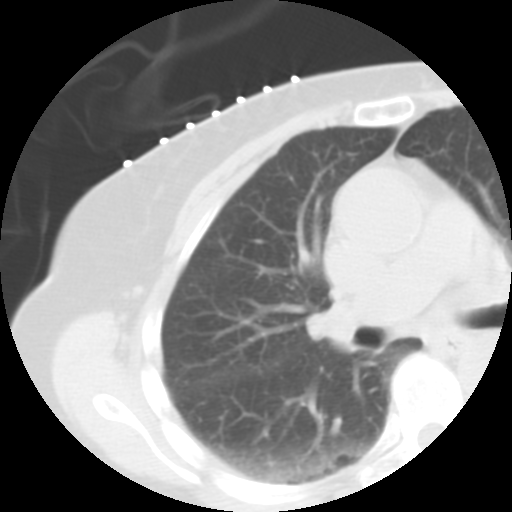
[im 11/21  lung]
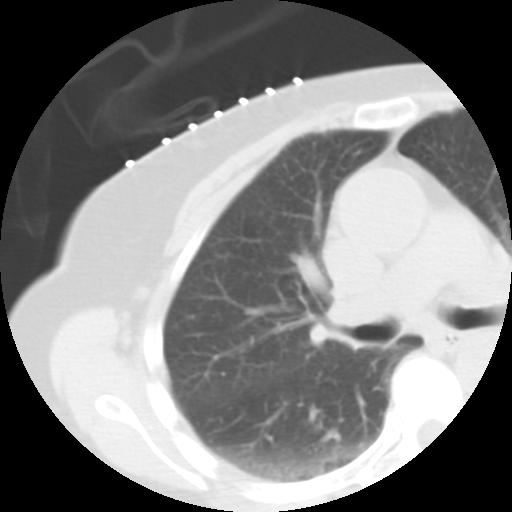
[im 12/21  mediastinal]
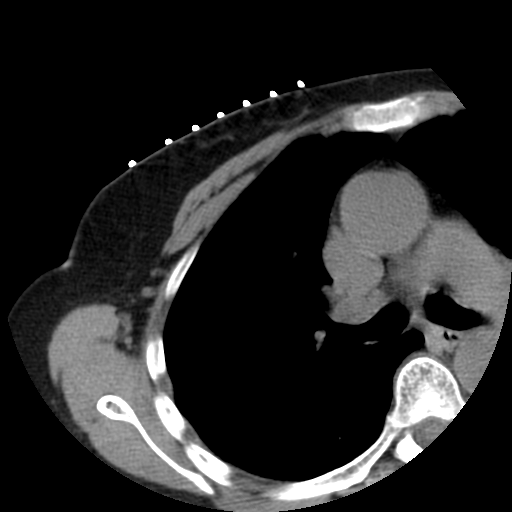
[im 12/21  lung]
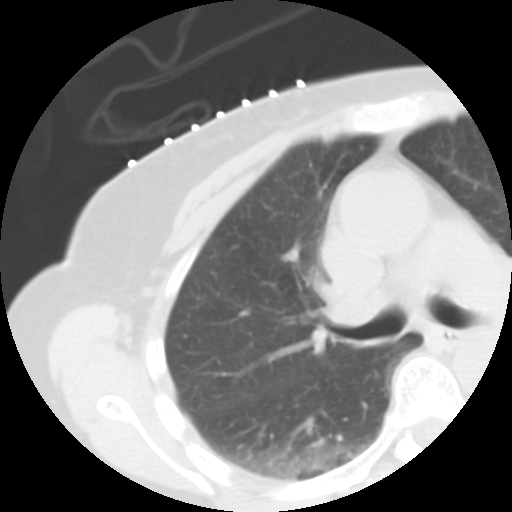
[im 14/21  lung]
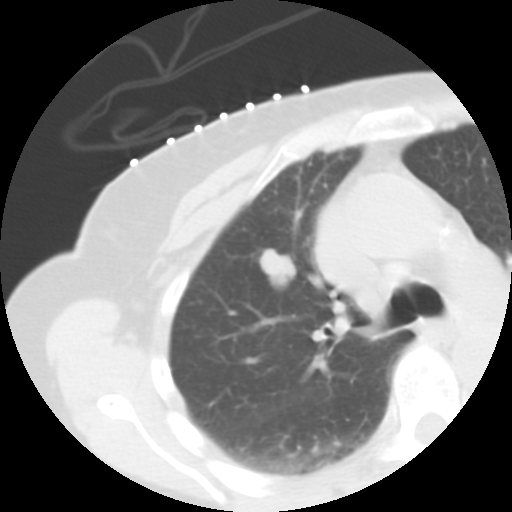
[im 15/21  lung]
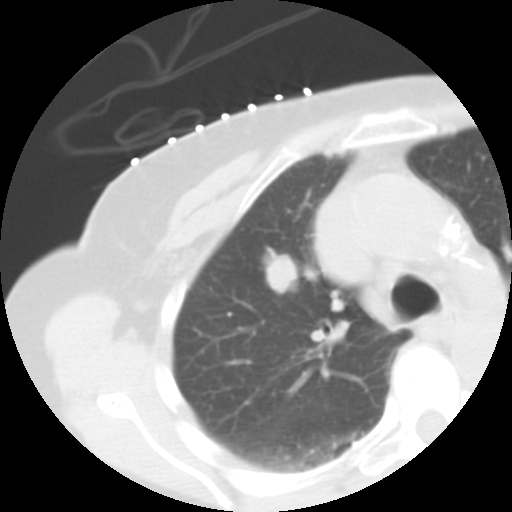
[im 16/21  lung]
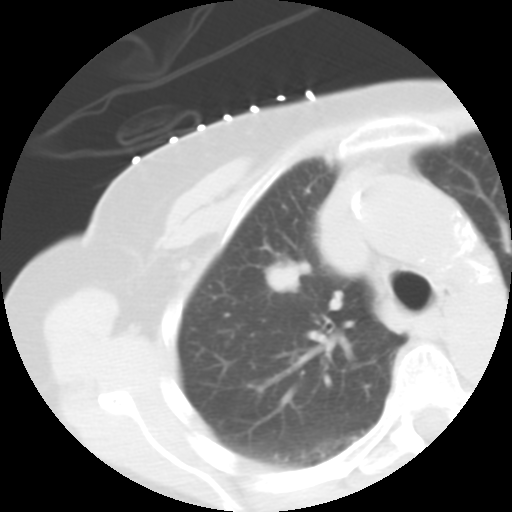
[im 17/21  mediastinal]
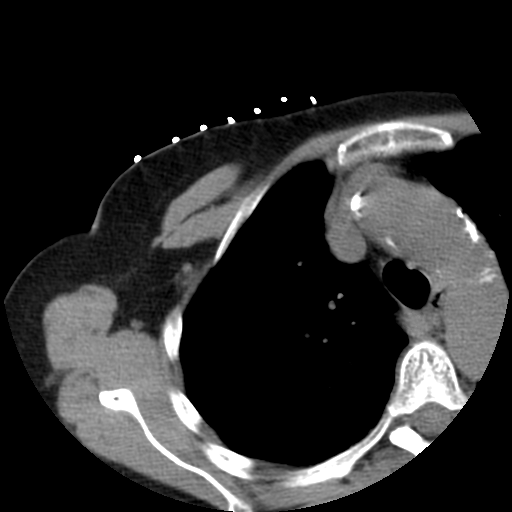
[im 17/21  lung]
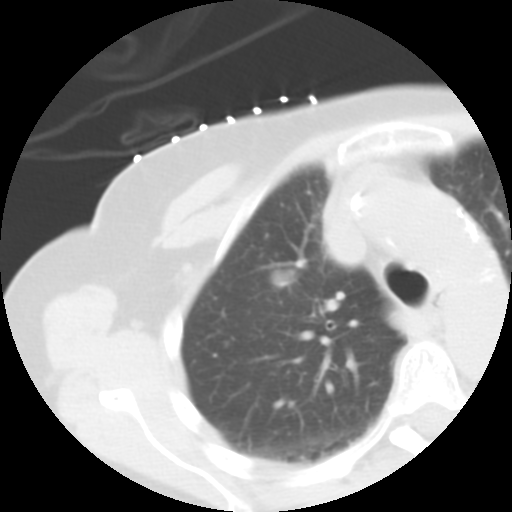
[im 19/21  lung]
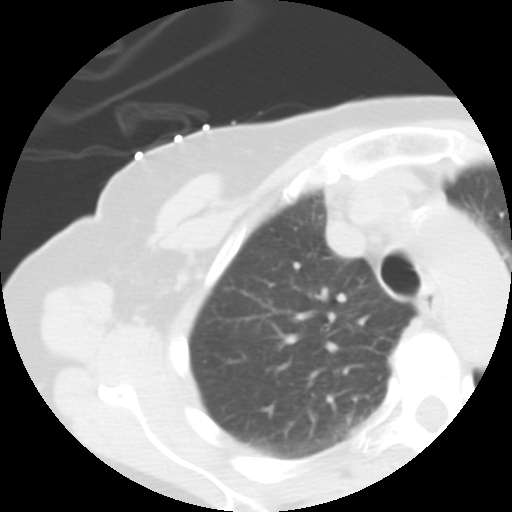
[im 20/21  lung]
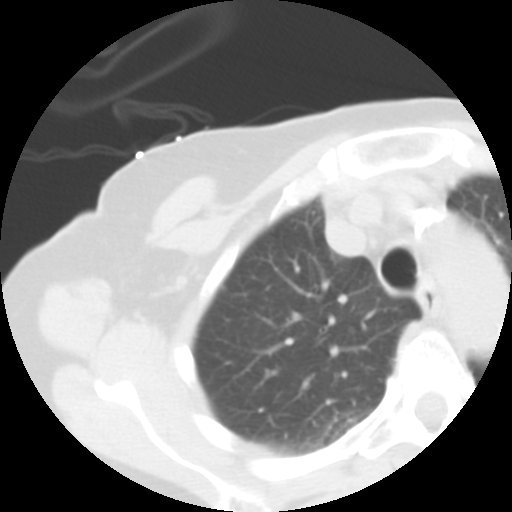

[15 of 21 positions shown; findings below may reference images not displayed]

CT-GUIDED CORE LUNG BIOPSY

Technique and findings: The procedure, risks (including but not
limited to bleeding, infection, organ damage, pneumothorax, chest
tube), benefits, and alternatives were explained to the patient.
Questions regarding the procedure were encouraged and answered.
The patient understands and consents to the procedure.Select axial
scans through the upper thorax were obtained and the lesion was
localized.  An appropriate skin entry site was determined. Operator
donned sterile gloves and mask.   Site was marked, prepped with
Betadine, draped in usual sterile fashion, infiltrated locally with
1% lidocaine.

Intravenous Fentanyl and Versed were administered as conscious
sedation during continuous cardiorespiratory monitoring by the
radiology RN, with a total moderate sedation time of 36 minutes.

Under CT fluoroscopic guidance, a 17 gauge trocar needle was
advanced to the margin of the lesion.  Once needle tip position was
confirmed, coaxial 18-gauge core biopsy samples were obtained.  The
guide needle was removed.  Postprocedure scans show regional
alveolar hemorrhage.  No pneumothorax.  The patient remained
stable.
IMPRESSION: 1.  Technically successful CT-guided core biopsy of right upper
lobe lung nodule

## 2014-03-01 ENCOUNTER — Other Ambulatory Visit: Payer: Self-pay | Admitting: *Deleted

## 2014-03-01 DIAGNOSIS — C3490 Malignant neoplasm of unspecified part of unspecified bronchus or lung: Secondary | ICD-10-CM

## 2014-04-16 ENCOUNTER — Ambulatory Visit
Admission: RE | Admit: 2014-04-16 | Discharge: 2014-04-16 | Disposition: A | Discharge status: Home / Self Care | Payer: Commercial Managed Care - HMO | Source: Ambulatory Visit | Attending: Thoracic Surgery (Cardiothoracic Vascular Surgery) | Admitting: Thoracic Surgery (Cardiothoracic Vascular Surgery)

## 2014-04-16 ENCOUNTER — Ambulatory Visit (INDEPENDENT_AMBULATORY_CARE_PROVIDER_SITE_OTHER): Payer: Medicare FFS | Admitting: Thoracic Surgery (Cardiothoracic Vascular Surgery)

## 2014-04-16 ENCOUNTER — Encounter: Payer: Self-pay | Admitting: Thoracic Surgery (Cardiothoracic Vascular Surgery)

## 2014-04-16 VITALS — BP 116/79 | HR 63 | Ht 66.0 in | Wt 152.0 lb

## 2014-04-16 DIAGNOSIS — Z902 Acquired absence of lung [part of]: Secondary | ICD-10-CM

## 2014-04-16 DIAGNOSIS — C3490 Malignant neoplasm of unspecified part of unspecified bronchus or lung: Secondary | ICD-10-CM

## 2014-04-16 DIAGNOSIS — Z9889 Other specified postprocedural states: Secondary | ICD-10-CM

## 2014-04-16 NOTE — Progress Notes (Signed)
HPI:   Alicia Huynh returns today for a scheduled 2 year followup visit.  She is a 72 year old woman who had a thoracoscopic right upper lobectomy for stage IA non-small cell carcinoma in June of 2013. I last are in the office in January of this year which time she was doing well with no evidence recurrent disease.  Since that visit she has done well. She denies any chest pain, shortness of breath, cough, wheezing, hemoptysis, weight loss, new bone or joint pain, headaches or visual changes.  Past Medical History  Diagnosis Date  . Hyperlipidemia     takes Pravastatin nightly  . COPD (chronic obstructive pulmonary disease)   . Left bundle branch block   . Tobacco abuse     50 pack years; one pack per day  . Lung mass June 2013    right; s/p VATS with RUL lobectomy per Dr. Roxan Hockey  . 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 NSTEMI (non-ST elevated myocardial infarction) June 2013    following RUL lobectomy with cath showing well preserved LV function and mild CAD - MI felt to be due to post op stress/spasm.       Current Outpatient Prescriptions  Medication Sig Dispense Refill  . pravastatin (PRAVACHOL) 40 MG tablet Take 40 mg by mouth daily.       No current facility-administered medications for this visit.    Physical Exam BP 116/79  Pulse 63  Ht 5\' 6"  (1.676 m)  Wt 152 lb (68.947 kg)  BMI 24.55 kg/m2  SpO38 9% 73 year old woman in no acute distress Well-developed and well-nourished Alert and oriented x3 with no neurologic deficits No cervical or subclavicular adenopathy Cardiac regular rate and rhythm normal S1 and S2 Lungs clear with equal breath sounds bilaterally Incisions well healed  Diagnostic Tests: CT CHEST WITHOUT CONTRAST  TECHNIQUE:  Multidetector CT imaging of the chest was performed following the  standard protocol without IV contrast.  COMPARISON: Chest CT 10/16/2013.  FINDINGS:  Mediastinum: Heart size is normal. There is no significant   pericardial fluid, thickening or pericardial calcification. There is  atherosclerosis of the thoracic aorta, the great vessels of the  mediastinum and the coronary arteries, including calcified  atherosclerotic plaque in the left anterior descending, left  circumflex and right coronary arteries. No pathologically enlarged  mediastinal or hilar lymph nodes. Please note that accurate  exclusion of hilar adenopathy is limited on noncontrast CT scans.  Esophagus is unremarkable in appearance.  Lungs/Pleura: Status post right upper lobectomy. There is a small  amount of scarring in the periphery of the right lung related to  prior surgery, which is unchanged. No suspicious appearing pulmonary  nodules or masses are identified. No acute consolidative airspace  disease. No pleural effusions.  Upper Abdomen: Exophytic 2.4 cm low-attenuation lesion in the  posterior aspect of the upper pole of the lobe right Kidney is  incompletely characterized, but similar to numerous prior  examinations, favored to represent a small cyst. Atherosclerosis.  Musculoskeletal: There are no aggressive appearing lytic or blastic  lesions noted in the visualized portions of the skeleton.  IMPRESSION:  1. Status post right upper lobectomy without findings to suggest  local recurrence of disease or metastatic disease in the thorax.  2. Atherosclerosis, including 3 vessel coronary artery disease.  Assessment for potential risk factor modification, dietary therapy  or pharmacologic therapy may be warranted, if clinically indicated.  Electronically Signed  By: Vinnie Langton M.D.  On: 04/16/2014 13:45   Impression:  73 year old woman who is now 2 years out from a thoracoscopic right upper lobectomy for a stage IA non-small cell carcinoma. She has no evidence recurrent disease. She continues to do extremely well. She quit smoking 2 years ago when she had her surgery.  Plan:  Return in 6 months with CT of chest

## 2014-08-21 ENCOUNTER — Other Ambulatory Visit (HOSPITAL_COMMUNITY): Payer: Self-pay | Admitting: Pulmonary Disease

## 2014-08-21 DIAGNOSIS — Z1231 Encounter for screening mammogram for malignant neoplasm of breast: Secondary | ICD-10-CM

## 2014-08-29 ENCOUNTER — Encounter (HOSPITAL_COMMUNITY): Payer: Self-pay | Admitting: Cardiology

## 2014-09-02 ENCOUNTER — Ambulatory Visit (HOSPITAL_COMMUNITY): Payer: Commercial Managed Care - HMO

## 2014-09-02 ENCOUNTER — Ambulatory Visit (HOSPITAL_COMMUNITY)
Admission: RE | Admit: 2014-09-02 | Discharge: 2014-09-02 | Disposition: A | Discharge status: Home / Self Care | Payer: Medicare FFS | Source: Ambulatory Visit | Attending: Pulmonary Disease | Admitting: Pulmonary Disease

## 2014-09-02 DIAGNOSIS — Z1231 Encounter for screening mammogram for malignant neoplasm of breast: Secondary | ICD-10-CM

## 2014-10-31 ENCOUNTER — Other Ambulatory Visit: Payer: Self-pay | Admitting: *Deleted

## 2014-10-31 DIAGNOSIS — C3490 Malignant neoplasm of unspecified part of unspecified bronchus or lung: Secondary | ICD-10-CM

## 2014-11-12 ENCOUNTER — Ambulatory Visit
Admission: RE | Admit: 2014-11-12 | Discharge: 2014-11-12 | Disposition: A | Discharge status: Home / Self Care | Payer: Commercial Managed Care - HMO | Source: Ambulatory Visit | Attending: Thoracic Surgery (Cardiothoracic Vascular Surgery) | Admitting: Thoracic Surgery (Cardiothoracic Vascular Surgery)

## 2014-11-12 ENCOUNTER — Ambulatory Visit (INDEPENDENT_AMBULATORY_CARE_PROVIDER_SITE_OTHER): Payer: Medicare FFS | Admitting: Thoracic Surgery (Cardiothoracic Vascular Surgery)

## 2014-11-12 ENCOUNTER — Encounter: Payer: Self-pay | Admitting: Thoracic Surgery (Cardiothoracic Vascular Surgery)

## 2014-11-12 VITALS — BP 130/74 | HR 69 | Resp 16 | Ht 66.0 in | Wt 151.0 lb

## 2014-11-12 DIAGNOSIS — Z902 Acquired absence of lung [part of]: Secondary | ICD-10-CM

## 2014-11-12 DIAGNOSIS — C3411 Malignant neoplasm of upper lobe, right bronchus or lung: Secondary | ICD-10-CM | POA: Diagnosis not present

## 2014-11-12 DIAGNOSIS — C3490 Malignant neoplasm of unspecified part of unspecified bronchus or lung: Secondary | ICD-10-CM

## 2014-11-12 DIAGNOSIS — Z9889 Other specified postprocedural states: Secondary | ICD-10-CM

## 2014-11-12 NOTE — Progress Notes (Signed)
HPI:  Alicia Huynh returns today for a scheduled 6 month follow-up visit.  She is a 74 year old woman who had a thoracoscopic right upper lobectomy in June 2013. She had a stage IA squamous cell carcinoma. She did not require adjuvant therapy. She quit smoking around the time of her surgery. She was last seen in the office in July 2015 for a 2 year follow-up visit. At that time she was doing well with no evidence recurrent disease.  In the interim since her last visit she's continued to do well. She denies any chest pain, shortness of breath, cough, hemoptysis, weight loss, headaches or visual changes. Her exercise tolerance is good. She says she doesn't really have pain from her incision, but she does protect that area.  Past Medical History  Diagnosis Date  . Hyperlipidemia     takes Pravastatin nightly  . COPD (chronic obstructive pulmonary disease)   . Left bundle branch block   . Tobacco abuse     50 pack years; one pack per day  . Lung mass June 2013    right; s/p VATS with RUL lobectomy per Dr. Roxan Hockey  . 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 NSTEMI (non-ST elevated myocardial infarction) June 2013    following RUL lobectomy with cath showing well preserved LV function and mild CAD - MI felt to be due to post op stress/spasm.       Current Outpatient Prescriptions  Medication Sig Dispense Refill  . pravastatin (PRAVACHOL) 40 MG tablet Take 40 mg by mouth daily.     No current facility-administered medications for this visit.    Physical Exam BP 130/74 mmHg  Pulse 69  Resp 16  Ht 5\' 6"  (1.676 m)  Wt 151 lb (68.493 kg)  BMI 24.38 kg/m2  SpO24 41% 74 year old woman in no acute distress Well-developed and well-nourished Alert and oriented 3 with no focal deficits Incisions well healed No cervical or supraclavicular adenopathy Cardiac regular rate and rhythm normal S1 and S2 Lungs decreased in right base otherwise clear  Diagnostic Tests: CT CHEST  WITHOUT CONTRAST  TECHNIQUE: Multidetector CT imaging of the chest was performed following the standard protocol without IV contrast.  COMPARISON: CT 04/16/2014  FINDINGS: Mediastinum/Nodes: No axillary or supraclavicular lymphadenopathy. No mediastinal hilar lymphadenopathy.  Lungs/Pleura: Volume loss in the right hemi thorax consistent with right upper lobectomy. There are surgical clips in the right hilum. No nodularity within the right lung. Mild linear atelectasis at the right lung base. The left lung is free of nodules.  Upper abdomen: Limited view of the liver, kidneys, pancreas are unremarkable. Normal adrenal glands.  Musculoskeletal: No aggressive osseous lesion.  IMPRESSION: 1. Postsurgical change in the right hemi thorax. 2. No evidence of lung cancer recurrence.   Electronically Signed  By: Suzy Bouchard M.D.  On: 11/12/2014 14:49   Impression: 74 year old woman who is now 2-1/2 years out from a thoracoscopic right upper lobectomy for a stage IA non-small cell carcinoma. She has no evidence of recurrent disease.  Overall she is doing well. Her appetite is good, she is not losing weight. [name] not having any chest pain or shortness of breath. She has not had any new or significant medical problems in the interim since her last visit.  I will plan to see her back in 6 months. We'll do a CT of the chest at that time for her 3 year follow-up visit. After that I think we can safely scan annually after that  I  spent 15 minutes face-to-face with Alicia Huynh C. Roxan Hockey, MD [facility] [phone]

## 2015-04-01 ENCOUNTER — Other Ambulatory Visit: Payer: Self-pay | Admitting: *Deleted

## 2015-04-01 DIAGNOSIS — R911 Solitary pulmonary nodule: Secondary | ICD-10-CM

## 2015-04-30 ENCOUNTER — Other Ambulatory Visit (HOSPITAL_COMMUNITY): Payer: Self-pay | Admitting: Pulmonary Disease

## 2015-04-30 DIAGNOSIS — Z780 Asymptomatic menopausal state: Secondary | ICD-10-CM

## 2015-05-06 ENCOUNTER — Ambulatory Visit (HOSPITAL_COMMUNITY)
Admission: RE | Admit: 2015-05-06 | Discharge: 2015-05-06 | Disposition: A | Discharge status: Home / Self Care | Payer: Medicare FFS | Source: Ambulatory Visit | Attending: Pulmonary Disease | Admitting: Pulmonary Disease

## 2015-05-06 DIAGNOSIS — Z780 Asymptomatic menopausal state: Secondary | ICD-10-CM | POA: Insufficient documentation

## 2015-05-06 LAB — HM DEXA SCAN

## 2015-05-20 ENCOUNTER — Ambulatory Visit (INDEPENDENT_AMBULATORY_CARE_PROVIDER_SITE_OTHER): Payer: Medicare FFS | Admitting: Thoracic Surgery (Cardiothoracic Vascular Surgery)

## 2015-05-20 ENCOUNTER — Inpatient Hospital Stay: Admission: RE | Admit: 2015-05-20 | Payer: Commercial Managed Care - HMO | Source: Ambulatory Visit

## 2015-05-20 ENCOUNTER — Encounter: Payer: Self-pay | Admitting: Thoracic Surgery (Cardiothoracic Vascular Surgery)

## 2015-05-20 ENCOUNTER — Ambulatory Visit
Admission: RE | Admit: 2015-05-20 | Discharge: 2015-05-20 | Disposition: A | Discharge status: Home / Self Care | Payer: Commercial Managed Care - HMO | Source: Ambulatory Visit | Attending: Thoracic Surgery (Cardiothoracic Vascular Surgery) | Admitting: Thoracic Surgery (Cardiothoracic Vascular Surgery)

## 2015-05-20 VITALS — BP 146/76 | HR 85 | Resp 16 | Ht 66.0 in | Wt 152.5 lb

## 2015-05-20 DIAGNOSIS — C3411 Malignant neoplasm of upper lobe, right bronchus or lung: Secondary | ICD-10-CM

## 2015-05-20 DIAGNOSIS — Z9889 Other specified postprocedural states: Secondary | ICD-10-CM | POA: Diagnosis not present

## 2015-05-20 DIAGNOSIS — C3491 Malignant neoplasm of unspecified part of right bronchus or lung: Secondary | ICD-10-CM | POA: Diagnosis not present

## 2015-05-20 DIAGNOSIS — Z902 Acquired absence of lung [part of]: Secondary | ICD-10-CM

## 2015-05-20 NOTE — Progress Notes (Signed)
[address]       [place], [address]             [phone]       HPI:  74 year old woman who returns for a scheduled 3 year follow-up visit.  Alicia Huynh is a 74 year old former smoker who had a thoracoscopic right upper lobectomy in June 2013 for a T1, N0, stage IA squamous cell carcinoma. We have followed her since that time. She was last seen in the office in February of this year which time there was no evidence of recurrent disease.  In the interim since her last visit she has been feeling well. Her appetite is good. She has not had any weight loss. She denies chest pain or shortness of breath. She has an occasional cough, denies hemoptysis. She has not had any unusual headaches or visual changes or any unusual bone or joint pain. She does occasionally have some wheezing.  Past Medical History  Diagnosis Date  . Hyperlipidemia     takes Pravastatin nightly  . COPD (chronic obstructive pulmonary disease)   . Left bundle branch block   . Tobacco abuse     50 pack years; one pack per day  . Lung mass June 2013    right; s/p VATS with RUL lobectomy per Dr. Roxan Hockey  . 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 NSTEMI (non-ST elevated myocardial infarction) June 2013    following RUL lobectomy with cath showing well preserved LV function and mild CAD - MI felt to be due to post op stress/spasm.    Past Surgical History  Procedure Laterality Date  . None    . Cholecystectomy  1yr ago  . Heel spur surgery      spurs removed-bilateral  . Tubal ligation    . Breast biopsy      yrs ago and it was benign  . Pft       done 02/01/12  . Right video-assisted thoracoscopy, right upper lobectomy, mediastinal lymph node dissection, placement of on-q local anesthetic  catheter  02/24/2012    Dr HRoxan Hockey . Left heart catheterization with coronary angiogram N/A 02/28/2012    Procedure: LEFT HEART CATHETERIZATION WITH CORONARY ANGIOGRAM;  Surgeon: Peter M  JMartinique MD;  [facility];  Service: Cardiovascular;  Laterality: N/A;      Current Outpatient Prescriptions  Medication Sig Dispense Refill  . pravastatin (PRAVACHOL) 40 MG tablet Take 40 mg by mouth daily.     No current facility-administered medications for this visit.    Physical Exam BP 146/76 mmHg  Pulse 85  Resp 16  Ht '5\' 6"'$  (1.676 m)  Wt 152 lb 8 oz (69.174 kg)  BMI 24.63 kg/m2  SpO2 97% Well-appearing 74 year old woman in no acute distress Alert and oriented 3 with no focal neurologic deficits No cervical or suprapubic or adenopathy Cardiac regular rate and rhythm normal S1 and S2 Lungs clear with essentially equal breath sounds bilaterally  Diagnostic Tests: CT CHEST WITHOUT CONTRAST  TECHNIQUE: Multidetector CT imaging of the chest was performed following the standard protocol without IV contrast.  COMPARISON: CT chest dated 11/12/2014  FINDINGS: Mediastinum/Nodes: Heart is normal in size. No pericardial effusion.  Coronary atherosclerosis.  Atherosclerotic calcifications aortic arch.  No suspicious mediastinal or axillary lymphadenopathy.  Visualized thyroid is mildly heterogeneous with a 1.4 cm inferior right thyroid nodule.  Lungs/Pleura: Status post right upper lobectomy.  Stable nodular scarring along the lateral right lung  base (series 6/ images 37 and 39).  No suspicious pulmonary nodules.  Mild centrilobular and paraseptal emphysematous changes.  No focal consolidation.  No pleural effusion or pneumothorax.  Upper abdomen: Visualized upper abdomen is notable for vascular calcifications and a 2.4 cm posterior right upper pole renal cyst.  Musculoskeletal: Mild degenerative changes of the visualized thoracolumbar spine.  IMPRESSION: Status post right upper lobectomy.  No evidence of recurrent or metastatic disease.   Electronically Signed  By: Julian Hy M.D.  On: 05/20/2015 12:40  I  personally reviewed the CT chest and concur with the findings as noted above  Impression: 74 year old woman who is now 3 years out from a thoracoscopic right upper lobectomy for a stage IA squamous cell carcinoma. She has no evidence of recurrent disease.  She quit smoking in 2013 when she was found to have cancer. Smoking cessation instruction/counseling given:  commended patient for quitting and reviewed strategies for preventing relapses   Her systolic blood pressure was mildly elevated. I recommended she follow up with her primary M.D. regarding that.   Plan:  Return in one year with CT chest.  I spent 10 minutes face-to-face with Alicia Huynh during this visit. Melrose Nakayama, MD [facility] [phone]

## 2015-05-21 ENCOUNTER — Other Ambulatory Visit: Payer: Commercial Managed Care - HMO

## 2015-08-25 ENCOUNTER — Other Ambulatory Visit (HOSPITAL_COMMUNITY): Payer: Self-pay | Admitting: Pulmonary Disease

## 2015-08-25 DIAGNOSIS — Z1231 Encounter for screening mammogram for malignant neoplasm of breast: Secondary | ICD-10-CM

## 2015-09-04 ENCOUNTER — Ambulatory Visit (HOSPITAL_COMMUNITY): Payer: Commercial Managed Care - HMO

## 2015-09-24 ENCOUNTER — Ambulatory Visit (HOSPITAL_COMMUNITY)
Admission: RE | Admit: 2015-09-24 | Discharge: 2015-09-24 | Disposition: A | Discharge status: Home / Self Care | Payer: Medicare FFS | Source: Ambulatory Visit | Attending: Pulmonary Disease | Admitting: Pulmonary Disease

## 2015-09-24 DIAGNOSIS — Z1231 Encounter for screening mammogram for malignant neoplasm of breast: Secondary | ICD-10-CM | POA: Insufficient documentation

## 2016-04-05 ENCOUNTER — Other Ambulatory Visit: Payer: Self-pay | Admitting: Thoracic Surgery (Cardiothoracic Vascular Surgery)

## 2016-04-05 DIAGNOSIS — C3490 Malignant neoplasm of unspecified part of unspecified bronchus or lung: Secondary | ICD-10-CM

## 2016-05-11 ENCOUNTER — Ambulatory Visit (INDEPENDENT_AMBULATORY_CARE_PROVIDER_SITE_OTHER): Payer: Medicare FFS | Admitting: Thoracic Surgery (Cardiothoracic Vascular Surgery)

## 2016-05-11 ENCOUNTER — Encounter: Payer: Self-pay | Admitting: Thoracic Surgery (Cardiothoracic Vascular Surgery)

## 2016-05-11 ENCOUNTER — Ambulatory Visit
Admission: RE | Admit: 2016-05-11 | Discharge: 2016-05-11 | Disposition: A | Discharge status: Home / Self Care | Payer: Medicare FFS | Source: Ambulatory Visit | Attending: Thoracic Surgery (Cardiothoracic Vascular Surgery) | Admitting: Thoracic Surgery (Cardiothoracic Vascular Surgery)

## 2016-05-11 VITALS — BP 126/76 | HR 64 | Resp 16 | Ht 66.0 in | Wt 149.7 lb

## 2016-05-11 DIAGNOSIS — Z902 Acquired absence of lung [part of]: Secondary | ICD-10-CM

## 2016-05-11 DIAGNOSIS — C3411 Malignant neoplasm of upper lobe, right bronchus or lung: Secondary | ICD-10-CM | POA: Diagnosis not present

## 2016-05-11 DIAGNOSIS — C3490 Malignant neoplasm of unspecified part of unspecified bronchus or lung: Secondary | ICD-10-CM

## 2016-05-11 DIAGNOSIS — C3491 Malignant neoplasm of unspecified part of right bronchus or lung: Secondary | ICD-10-CM | POA: Diagnosis not present

## 2016-05-11 NOTE — Progress Notes (Signed)
      [address]       [place] [address]             [phone]       HPI: Alicia Huynh returns today for scheduled for year follow-up.  [name] is a 75 year old woman who had a thoracoscopic right upper lobectomy for a stage IA squamous cell carcinoma in June 2013. I last saw her in the office a year ago. She was doing well at that time with no evidence recurrent disease.  She quit smoking in May 2013 just before her surgery.  She is feeling well. She remains very active. [name] not having any chest pain or shortness of breath. She denies hemoptysis. She has an occasional cough. No unusual headaches, visual changes, bone or joint pain.  Past Medical History:  Diagnosis Date  . COPD (chronic obstructive pulmonary disease) ([place])   . Hyperlipidemia    takes Pravastatin nightly  . Left bundle branch block   . Lung mass June 2013   right; s/p VATS with RUL lobectomy per Dr. Roxan Hockey  . NSTEMI (non-ST elevated myocardial infarction) [facility]) June 2013   following RUL lobectomy with cath showing well preserved LV function and mild CAD - MI felt to be due to post op stress/spasm.   Marland Kitchen Squamous cell carcinoma lung [facility]) June 2013  . Tobacco abuse    50 pack years; one pack per day      Current Outpatient Prescriptions  Medication Sig Dispense Refill  . pravastatin (PRAVACHOL) 40 MG tablet Take 40 mg by mouth daily.     No current facility-administered medications for this visit.     Physical Exam  Diagnostic Tests: CT CHEST WITHOUT CONTRAST  TECHNIQUE: Multidetector CT imaging of the chest was performed following the standard protocol without IV contrast.  COMPARISON:  05/20/2015 and previous  FINDINGS: Cardiovascular: Aortic atherosclerosis.  No pericardial fluid.  Mediastinum/Nodes: No mediastinal or hilar mass or lymphadenopathy seen.  Lungs/Pleura: Previous right upper lobectomy. No evidence of residual or recurrent mass. Chronic  scarring at the right base laterally appears the same. No new lesion. Mild background emphysema. No pleural fluid.  Upper Abdomen: Sub cm low-density at the dome of the liver is unchanged. Sub cm cyst in the lateral segment of left lobe of liver is unchanged. Renal cysts again noted.  Musculoskeletal: Negative  IMPRESSION: Stable and uncomplicated exam. Previous right upper lobectomy. Stable chronic scarring at the right lung base laterally. No evidence of residual or recurrent disease.   Electronically Signed   By: Nelson Chimes M.D.   On: 05/11/2016 14:34  I personally reviewed the CT chest and concur with the findings as noted above.  Impression: 75 year old woman who is now 4 years out from a thoracoscopic right upper lobectomy for stage IA non-small cell carcinoma. She has no evidence of recurrent disease.  She quit smoking back in 2013.  She remains active  Plan:  Return in one year for 5 year follow-up visit. We will do a CT of the chest at that time Melrose Nakayama, MD [facility] [phone]

## 2016-08-18 ENCOUNTER — Other Ambulatory Visit (HOSPITAL_COMMUNITY): Payer: Self-pay | Admitting: Pulmonary Disease

## 2016-08-18 DIAGNOSIS — Z1231 Encounter for screening mammogram for malignant neoplasm of breast: Secondary | ICD-10-CM

## 2016-09-27 ENCOUNTER — Ambulatory Visit (HOSPITAL_COMMUNITY): Payer: Medicare FFS

## 2016-10-04 ENCOUNTER — Ambulatory Visit (HOSPITAL_COMMUNITY)
Admission: RE | Admit: 2016-10-04 | Discharge: 2016-10-04 | Disposition: A | Discharge status: Home / Self Care | Payer: Medicare FFS | Source: Ambulatory Visit | Attending: Pulmonary Disease | Admitting: Pulmonary Disease

## 2016-10-04 DIAGNOSIS — Z1231 Encounter for screening mammogram for malignant neoplasm of breast: Secondary | ICD-10-CM

## 2017-04-01 ENCOUNTER — Other Ambulatory Visit: Payer: Self-pay | Admitting: *Deleted

## 2017-04-01 DIAGNOSIS — R911 Solitary pulmonary nodule: Secondary | ICD-10-CM

## 2017-04-01 DIAGNOSIS — Z902 Acquired absence of lung [part of]: Secondary | ICD-10-CM

## 2017-05-17 ENCOUNTER — Ambulatory Visit (INDEPENDENT_AMBULATORY_CARE_PROVIDER_SITE_OTHER): Payer: Medicare FFS | Admitting: Thoracic Surgery (Cardiothoracic Vascular Surgery)

## 2017-05-17 ENCOUNTER — Encounter: Payer: Self-pay | Admitting: Thoracic Surgery (Cardiothoracic Vascular Surgery)

## 2017-05-17 ENCOUNTER — Ambulatory Visit
Admission: RE | Admit: 2017-05-17 | Discharge: 2017-05-17 | Disposition: A | Discharge status: Home / Self Care | Payer: Medicare FFS | Source: Ambulatory Visit | Attending: Thoracic Surgery (Cardiothoracic Vascular Surgery) | Admitting: Thoracic Surgery (Cardiothoracic Vascular Surgery)

## 2017-05-17 VITALS — BP 135/71 | HR 60 | Resp 16 | Ht 66.0 in | Wt 149.0 lb

## 2017-05-17 DIAGNOSIS — C3491 Malignant neoplasm of unspecified part of right bronchus or lung: Secondary | ICD-10-CM | POA: Diagnosis not present

## 2017-05-17 DIAGNOSIS — C3411 Malignant neoplasm of upper lobe, right bronchus or lung: Secondary | ICD-10-CM | POA: Diagnosis not present

## 2017-05-17 DIAGNOSIS — Z902 Acquired absence of lung [part of]: Secondary | ICD-10-CM | POA: Diagnosis not present

## 2017-05-17 DIAGNOSIS — R911 Solitary pulmonary nodule: Secondary | ICD-10-CM

## 2017-05-17 NOTE — Progress Notes (Signed)
      [address]       [place] [address]             [phone]     HPI: Mrs. Dsouza returns for her 5 year visit.  Mrs. Olazabal is a 76 year old woman who had a thoracoscopic right upper lobectomy for stage IA squamous cell carcinoma in June 2013. She did not require adjuvant therapy.  I saw her a year ago. She was doing well at that time with no evidence of recurrent disease.  She quit smoking in May 2013.  She feels well. She remains active. She does not have any chest pain or shortness of breath. Occasional cough. No wheezing or hemoptysis. No unusual headaches, visual changes, or bone or joint pain. Her appetite is good and her weight is stable Past Medical History:  Diagnosis Date  . COPD (chronic obstructive pulmonary disease) ([place])   . Hyperlipidemia    takes Pravastatin nightly  . Left bundle branch block   . Lung mass June 2013   right; s/p VATS with RUL lobectomy per Dr. Roxan Hockey  . NSTEMI (non-ST elevated myocardial infarction) [facility]) June 2013   following RUL lobectomy with cath showing well preserved LV function and mild CAD - MI felt to be due to post op stress/spasm.   Marland Kitchen Squamous cell carcinoma lung [facility]) June 2013  . Tobacco abuse    50 pack years; one pack per day     Current Outpatient Prescriptions  Medication Sig Dispense Refill  . pravastatin (PRAVACHOL) 40 MG tablet Take 40 mg by mouth daily.     No current facility-administered medications for this visit.     Physical Exam BP 135/71 (BP Location: Right Arm, Patient Position: Sitting, Cuff Size: Large)   Pulse 60   Resp 16   Ht 5\' 6"  (1.676 m)   Wt 149 lb (67.6 kg)   SpO2 98% Comment: ON RA  BMI 24.23 kg/m  76 year old woman in no acute distress Alert and oriented 3 with no focal deficits Lungs clear bilaterally Cardiac regular rate and rhythm normal S1 and S2 No cervical or supraclavicular adenopathy  Diagnostic Tests: CT CHEST WITHOUT  CONTRAST  TECHNIQUE: Multidetector CT imaging of the chest was performed following the standard protocol without IV contrast.  COMPARISON:  05/11/2016  FINDINGS: Cardiovascular: Heart is normal size. Aorta is normal caliber. Moderate coronary artery and aortic calcifications.  Mediastinum/Nodes: No mediastinal, hilar, or axillary adenopathy.  Lungs/Pleura: Postoperative changes in the right lung. Scarring at the right base is stable. No pulmonary nodules or confluent airspace opacities. No effusions.  Upper Abdomen: Imaging into the upper abdomen shows no acute findings.  Musculoskeletal: No acute bony abnormality.  IMPRESSION: Stable postoperative changes in the right lung. No evidence of recurrent or residual disease.  Coronary artery disease.  Aortic Atherosclerosis (ICD10-I70.0).   Electronically Signed   By: Rolm Baptise M.D.   On: 05/17/2017 13:03 I personally reviewed the CT chest and concur with the findings noted above  Impression: Mrs. Balthazar 76 year old woman who had a thoracoscopic right upper lobectomy for stage IA non-small cell carcinoma 5 years ago. She has no evidence of recurrent disease. At this point she is considered cured.  Tobacco abuse- quit in 2013.  Aortic atherosclerosis- she does have hyperlipidemia. She is on a statin.  Plan: Return in one year with PA and lateral chest x-ray.  Melrose Nakayama, MD [facility] [phone]

## 2017-09-14 ENCOUNTER — Other Ambulatory Visit (HOSPITAL_COMMUNITY): Payer: Self-pay | Admitting: Pulmonary Disease

## 2017-09-14 DIAGNOSIS — Z1231 Encounter for screening mammogram for malignant neoplasm of breast: Secondary | ICD-10-CM

## 2017-10-10 ENCOUNTER — Ambulatory Visit (HOSPITAL_COMMUNITY): Payer: Medicare FFS

## 2017-10-14 ENCOUNTER — Other Ambulatory Visit (HOSPITAL_COMMUNITY): Payer: Self-pay | Admitting: Pulmonary Disease

## 2017-10-14 DIAGNOSIS — Z1231 Encounter for screening mammogram for malignant neoplasm of breast: Secondary | ICD-10-CM

## 2017-10-20 ENCOUNTER — Ambulatory Visit (HOSPITAL_COMMUNITY): Payer: Medicare FFS

## 2017-10-27 ENCOUNTER — Ambulatory Visit (HOSPITAL_COMMUNITY)
Admission: RE | Admit: 2017-10-27 | Discharge: 2017-10-27 | Disposition: A | Discharge status: Home / Self Care | Payer: Medicare FFS | Source: Ambulatory Visit | Attending: Pulmonary Disease | Admitting: Pulmonary Disease

## 2017-10-27 DIAGNOSIS — Z1231 Encounter for screening mammogram for malignant neoplasm of breast: Secondary | ICD-10-CM | POA: Diagnosis present

## 2018-03-10 ENCOUNTER — Ambulatory Visit: Payer: Medicare FFS | Admitting: Neurology

## 2018-03-10 ENCOUNTER — Encounter: Payer: Self-pay | Admitting: Neurology

## 2018-03-10 DIAGNOSIS — G250 Essential tremor: Secondary | ICD-10-CM | POA: Diagnosis not present

## 2018-03-10 HISTORY — DX: Essential tremor: G25.0

## 2018-03-10 NOTE — Progress Notes (Signed)
Reason for visit: Tremor  Referring physician: Dr. Alleen Borne is a 77 y.o. female  History of present illness:  Alicia Huynh is a 77 year old left-handed white female with a history of lung cancer diagnosed in 2013.  The patient required a lobectomy but no chemotherapy or radiation, no recurrence was noted after 5 years.  The patient has had onset over the last 6 to 8 months of a tremor that has affected the jaw, she has also noted a vocal tremor at times.  She has tremors in the arms, but the left arm is more significantly affected.  The patient notes some problems with feeding herself, she denies any significant change in handwriting.  The patient denies any significant balance issues, she has not had any falls.  She reports no numbness or weakness of the extremities.  She reports no family history of tremor.  There have been no problems with headaches or dizziness or vision changes.  The patient comes to this office for an evaluation.  Past Medical History:  Diagnosis Date  . COPD (chronic obstructive pulmonary disease) ([place])   . Hyperlipidemia    takes Pravastatin nightly  . Left bundle branch block   . Lung mass June 2013   right; s/p VATS with RUL lobectomy per Dr. Roxan Hockey  . NSTEMI (non-ST elevated myocardial infarction) [facility]) June 2013   following RUL lobectomy with cath showing well preserved LV function and mild CAD - MI felt to be due to post op stress/spasm.   Marland Kitchen Squamous cell carcinoma lung [facility]) June 2013  . Tobacco abuse    50 pack years; one pack per day    Past Surgical History:  Procedure Laterality Date  . BREAST BIOPSY     yrs ago and it was benign  . CHOLECYSTECTOMY  97yrs ago  . HEEL SPUR SURGERY     spurs removed-bilateral  . LEFT HEART CATHETERIZATION WITH CORONARY ANGIOGRAM N/A 02/28/2012   Procedure: LEFT HEART CATHETERIZATION WITH CORONARY ANGIOGRAM;  Surgeon: Peter M Martinique, MD;  [facility] LAB;  Service: Cardiovascular;   Laterality: N/A;  . none    . pft      done 02/01/12  . Right video-assisted thoracoscopy, right upper lobectomy, mediastinal lymph node dissection, placement of On-Q local anesthetic  catheter  02/24/2012   Dr Roxan Hockey  . TUBAL LIGATION      Family History  Problem Relation Age of Onset  . Lymphoma Mother        12  . Heart attack Father        2  . Heart attack Brother        40 -Bypass X3. another Bypass at [facility] failed  . Heart defect Brother        heart surgery  . Heart attack Daughter        stents X3  . Anesthesia problems Neg Hx   . Hypotension Neg Hx   . Malignant hyperthermia Neg Hx   . Pseudochol deficiency Neg Hx     Social history:  reports that she quit smoking about 6 years ago. Her smoking use included cigarettes. She has a 50.00 pack-year smoking history. She has never used smokeless tobacco. She reports that she does not drink alcohol or use drugs.  Medications:  Prior to Admission medications   Medication Sig Start Date End Date Taking? Authorizing Provider  aspirin EC 81 MG tablet Take 81 mg by mouth daily.   Yes [provider]  Cholecalciferol (VITAMIN D-3) 5000 units TABS Take 5,000 Units by mouth daily.   Yes [provider]  pravastatin (PRAVACHOL) 40 MG tablet Take 40 mg by mouth daily.   Yes [provider]      Allergies  Allergen Reactions  . Codeine Shortness Of Breath    ROS:  Out of a complete 14 system review of symptoms, the patient complains only of the following symptoms, and all other reviewed systems are negative.  Tremor  Blood pressure (!) 157/76, pulse 76, height 5\' 7"  (1.702 m), weight 148 lb (67.1 kg).  Physical Exam  General: The patient is alert and cooperative at the time of the examination.  Eyes: Pupils are equal, round, and reactive to light. Discs are flat bilaterally.  Neck: The neck is supple, no carotid bruits are noted.  Respiratory: The respiratory examination is  clear.  Cardiovascular: The cardiovascular examination reveals a regular rate and rhythm, no obvious murmurs or rubs are noted.  Skin: Extremities are without significant edema.  Neurologic Exam  Mental status: The patient is alert and oriented x 3 at the time of the examination. The patient has apparent normal recent and remote memory, with an apparently normal attention span and concentration ability.  Cranial nerves: Facial symmetry is present. There is good sensation of the face to pinprick and soft touch bilaterally. The strength of the facial muscles and the muscles to head turning and shoulder shrug are normal bilaterally. Speech is well enunciated, no aphasia or dysarthria is noted. Extraocular movements are full. Visual fields are full. The tongue is midline, and the patient has symmetric elevation of the soft palate. No obvious hearing deficits are noted.  A jaw tremor is noted.  Motor: The motor testing reveals 5 over 5 strength of all 4 extremities. Good symmetric motor tone is noted throughout.  Sensory: Sensory testing is intact to pinprick, soft touch, vibration sensation, and position sense on all 4 extremities. No evidence of extinction is noted.  Coordination: Cerebellar testing reveals good finger-nose-finger and heel-to-shin bilaterally.  An intention tremor with the left greater than right upper extremity is seen.  Gait and station: Gait is normal. Tandem gait is slightly unsteady. Romberg is negative. No drift is seen.  Reflexes: Deep tendon reflexes are symmetric and normal bilaterally. Toes are downgoing bilaterally.   Assessment/Plan:  1.  Essential tremor  The patient appears to have tremor affecting the jaw, vocal tremor, and left greater than right upper extremity tremor.  The patient likely has an essential tremor even though there is no family history.  The patient does not wish to go on medications for the tremor currently, she will follow-up in 6 months, she  can call our office at any time if she desires to go on a medication.  Jill Alexanders MD 03/10/2018 11:02 AM  [facility] [address] [place], [place] [address]  Phone [phone] Fax [phone]

## 2018-05-09 ENCOUNTER — Encounter: Payer: Medicare FFS | Admitting: Thoracic Surgery (Cardiothoracic Vascular Surgery)

## 2018-07-12 ENCOUNTER — Other Ambulatory Visit (HOSPITAL_COMMUNITY): Payer: Self-pay | Admitting: Pulmonary Disease

## 2018-07-12 ENCOUNTER — Ambulatory Visit (HOSPITAL_COMMUNITY)
Admission: RE | Admit: 2018-07-12 | Discharge: 2018-07-12 | Disposition: A | Discharge status: Home / Self Care | Payer: Medicare FFS | Source: Ambulatory Visit | Attending: Pulmonary Disease | Admitting: Pulmonary Disease

## 2018-07-12 DIAGNOSIS — Z85118 Personal history of other malignant neoplasm of bronchus and lung: Secondary | ICD-10-CM | POA: Diagnosis present

## 2018-07-12 DIAGNOSIS — Z902 Acquired absence of lung [part of]: Secondary | ICD-10-CM | POA: Insufficient documentation

## 2018-07-12 LAB — CBC AND DIFFERENTIAL
HCT: 42 (ref 36–46)
Hemoglobin: 14.4 (ref 12.0–16.0)
Platelets: 240 (ref 150–399)
WBC: 6.6

## 2018-07-12 LAB — HEPATIC FUNCTION PANEL
ALT: 10 (ref 7–35)
AST: 14 (ref 13–35)
Alkaline Phosphatase: 52 (ref 25–125)
Bilirubin, Total: 0.5

## 2018-07-12 LAB — BASIC METABOLIC PANEL
BUN: 19 (ref 4–21)
CO2: 30 — AB (ref 13–22)
Chloride: 107 (ref 99–108)
Creatinine: 0.9 (ref <=1.1)
Potassium: 5 (ref 3.4–5.3)
Sodium: 142 (ref 137–147)

## 2018-07-12 LAB — LIPID PANEL
Cholesterol: 154 (ref 0–200)
HDL: 55 (ref 35–70)
LDL Cholesterol: 86
Triglycerides: 53 (ref 40–160)

## 2018-07-12 LAB — CBC: RBC: 4.69 (ref 3.87–5.11)

## 2018-07-12 LAB — TSH: TSH: 1.46 (ref 0.41–5.90)

## 2018-07-12 LAB — COMPREHENSIVE METABOLIC PANEL: GFR calc non Af Amer: 66

## 2018-07-13 LAB — BASIC METABOLIC PANEL
Creatinine: 0.9 (ref <=1.1)
Glucose: 87

## 2018-10-04 NOTE — Progress Notes (Signed)
[facility]  PATIENT: Alicia Huynh DOB: Dec 15, 1940   REASON FOR VISIT: Follow-up for essential tremor HISTORY FROM:patient     HISTORY OF PRESENT ILLNESS:UPDATE 1/16/2020CM Alicia Huynh, 78 year old female returns for follow-up with history of essential tremor which affects her jaw and her voice at times.  She also has a tremor in her left hand particularly when eating.  She denies any change in her handwriting.  She does have problems feeding herself at times she is left-handed.  She reports no numbness or weakness of the extremities.  She denies any visual changes headache or dizziness.  She denies any balance issues.  She continues to be independent in all activities of daily living.  She has no difficulty with driving.  She returns for reevaluation 6/21/19[name] Huynh is a 78 year old left-handed white female with a history of lung cancer diagnosed in 2013.  The patient required a lobectomy but no chemotherapy or radiation, no recurrence was noted after 5 years.  The patient has had onset over the last 6 to 8 months of a tremor that has affected the jaw, she has also noted a vocal tremor at times.  She has tremors in the arms, but the left arm is more significantly affected.  The patient notes some problems with feeding herself, she denies any significant change in handwriting.  The patient denies any significant balance issues, she has not had any falls.  She reports no numbness or weakness of the extremities.  She reports no family history of tremor.  There have been no problems with headaches or dizziness or vision changes.  The patient comes to this office for an evaluation.   REVIEW OF SYSTEMS: Full 14 system review of systems performed and notable only for those listed, all others are neg:  Constitutional: neg  Cardiovascular: neg Ear/Nose/Throat: neg  Skin: neg Eyes: neg Respiratory: neg Gastroitestinal: neg  Hematology/Lymphatic: neg  Endocrine:  neg Musculoskeletal:neg Allergy/Immunology: neg Neurological: Tremor Psychiatric: neg Sleep : neg   ALLERGIES: Allergies  Allergen Reactions  . Codeine Shortness Of Breath    HOME MEDICATIONS: Outpatient Medications Prior to Visit  Medication Sig Dispense Refill  . aspirin EC 81 MG tablet Take 81 mg by mouth daily.    . Cholecalciferol (VITAMIN D-3) 5000 units TABS Take 5,000 Units by mouth daily.    . pravastatin (PRAVACHOL) 40 MG tablet Take 40 mg by mouth daily.     No facility-administered medications prior to visit.     PAST MEDICAL HISTORY: Past Medical History:  Diagnosis Date  . COPD (chronic obstructive pulmonary disease) ([place])   . Essential tremor 03/10/2018  . Hyperlipidemia    takes Pravastatin nightly  . Left bundle branch block   . Lung mass June 2013   right; s/p VATS with RUL lobectomy per Dr. Roxan Hockey  . NSTEMI (non-ST elevated myocardial infarction) [facility]) June 2013   following RUL lobectomy with cath showing well preserved LV function and mild CAD - MI felt to be due to post op stress/spasm.   Marland Kitchen Squamous cell carcinoma lung [facility]) June 2013  . Tobacco abuse    50 pack years; one pack per day    PAST SURGICAL HISTORY: Past Surgical History:  Procedure Laterality Date  . BREAST BIOPSY     yrs ago and it was benign  . CHOLECYSTECTOMY  73yrs ago  . HEEL SPUR SURGERY     spurs removed-bilateral  . LEFT HEART CATHETERIZATION WITH CORONARY ANGIOGRAM N/A 02/28/2012   Procedure: LEFT  HEART CATHETERIZATION WITH CORONARY ANGIOGRAM;  Surgeon: Peter M Martinique, MD;  Location: [facility] CATH LAB;  Service: Cardiovascular;  Laterality: N/A;  . none    . pft      done 02/01/12  . Right video-assisted thoracoscopy, right upper lobectomy, mediastinal lymph node dissection, placement of On-Q local anesthetic  catheter  02/24/2012   Dr Roxan Hockey  . TUBAL LIGATION      FAMILY HISTORY: Family History  Problem Relation Age of Onset  . Lymphoma Mother        27  .  Heart attack Father        6  . Heart attack Brother        40 -Bypass X3. another Bypass at [facility] failed  . Heart defect Brother        heart surgery  . Heart attack Daughter        stents X3  . Anesthesia problems Neg Hx   . Hypotension Neg Hx   . Malignant hyperthermia Neg Hx   . Pseudochol deficiency Neg Hx     SOCIAL HISTORY: Social History   Socioeconomic History  . Marital status: Married    Spouse name: Not on file  . Number of children: Not on file  . Years of education: Not on file  . Highest education level: Not on file  Occupational History  . Not on file  Social Needs  . Financial resource strain: Not on file  . Food insecurity:    Worry: Not on file    Inability: Not on file  . Transportation needs:    Medical: Not on file    Non-medical: Not on file  Tobacco Use  . Smoking status: Former Smoker    Packs/day: 1.00    Years: 50.00    Pack years: 50.00    Types: Cigarettes    Last attempt to quit: 02/03/2012    Years since quitting: 6.6  . Smokeless tobacco: Never Used  Substance and Sexual Activity  . Alcohol use: No  . Drug use: No  . Sexual activity: Not Currently  Lifestyle  . Physical activity:    Days per week: Not on file    Minutes per session: Not on file  . Stress: Not on file  Relationships  . Social connections:    Talks on phone: Not on file    Gets together: Not on file    Attends religious service: Not on file    Active member of club or organization: Not on file    Attends meetings of clubs or organizations: Not on file    Relationship status: Not on file  . Intimate partner violence:    Fear of current or ex partner: Not on file    Emotionally abused: Not on file    Physically abused: Not on file    Forced sexual activity: Not on file  Other Topics Concern  . Not on file  Social History Narrative  . Not on file     PHYSICAL EXAM  Vitals:   10/05/18 1046  BP: (!) 144/81  Pulse: 68  Weight: 150 lb 3.2 oz (68.1 kg)   Height: 5\' 7"  (1.702 m)   Body mass index is 23.52 kg/m.  Generalized: Well developed, in no acute distress  Head: normocephalic and atraumatic,. Oropharynx benign  Neck: Supple,  Musculoskeletal: No deformity  Skin no rash or edema Neurological examination   Mentation: Alert oriented to time, place, history taking. Attention span and concentration appropriate. Recent and  remote memory intact.  Follows all commands speech and language fluent.   Cranial nerve II-XII: Pupils were equal round reactive to light extraocular movements were full, visual field were full on confrontational test. Facial sensation and strength were normal. hearing was intact to finger rubbing bilaterally. Uvula tongue midline. head turning and shoulder shrug were normal and symmetric.Tongue protrusion into cheek strength was normal.  Jaw tremor noted Motor: normal bulk and tone, full strength in the BUE, BLE, Sensory: normal and symmetric to light touch, pinprick, and  Vibration, in all extremities Coordination: finger-nose-finger, heel-to-shin bilaterally, no dysmetria and intention tremor with the left greater than right upper extremity is seen. Reflexes: Brachioradialis 2/2, biceps 2/2, triceps 2/2, patellar 2/2, Achilles 2/2, plantar responses were flexor bilaterally. Gait and Station: Rising up from seated position without assistance, normal stance,  moderate stride, good arm swing, smooth turning, able to perform tiptoe, and heel walking without difficulty. Tandem gait is mildly unsteady  DIAGNOSTIC DATA (LABS, IMAGING, TESTING) - I reviewed patient records, labs, notes, testing and imaging myself where available.  Lab Results  Component Value Date   WBC 7.7 03/03/2012   HGB 11.6 (L) 03/03/2012   HCT 34.1 (L) 03/03/2012   MCV 91.9 03/03/2012   PLT 246 03/03/2012      Component Value Date/Time   NA 137 03/02/2012 0500   K 3.6 03/02/2012 0500   CL 101 03/02/2012 0500   CO2 27 03/02/2012 0500    GLUCOSE 113 (H) 03/02/2012 0500   BUN 9 03/02/2012 0500   CREATININE 0.52 03/02/2012 0500   CREATININE 0.95 01/11/2012 1049   CALCIUM 8.6 03/02/2012 0500   PROT 5.4 (L) 02/27/2012 1259   ALBUMIN 2.8 (L) 02/27/2012 1259   AST 69 (H) 02/27/2012 1259   ALT 137 (H) 02/27/2012 1259   ALKPHOS 68 02/27/2012 1259   BILITOT 0.6 02/27/2012 1259   GFRNONAA >90 03/02/2012 0500   GFRAA >90 03/02/2012 0500   ASSESSMENT AND PLAN  78 y.o. year old female  has a past medical history of COPD (chronic obstructive pulmonary disease) ([place]), Essential tremor (03/10/2018), Hyperlipidemia, Left bundle branch block, Lung mass (June 2013), NSTEMI (non-ST elevated myocardial infarction) [facility]) (June 2013), Squamous cell carcinoma lung [facility]) (June 2013), and Tobacco abuse. here to follow-up for essential tremor which mainly affects her jaw mild vocal tremor and left greater than right upper extremity tremor   PLAN: Tremor affects eating suggest weighted utensils Given information of essential tremor Will delay starting medication at this time per pt request F/U in 6 months I spent 25 minutes in total face to face time with the patient more than 50% of which was spent counseling and coordination of care, reviewing test results reviewing medications and discussing and reviewing the diagnosis of essential tremor and further treatment options. ,  Rayburn Ma, [facility], APRN  [facility] [address], [place], [place] [address] [phone]

## 2018-10-05 ENCOUNTER — Encounter: Payer: Self-pay | Admitting: Nurse Practitioner

## 2018-10-05 ENCOUNTER — Ambulatory Visit: Payer: Medicare FFS | Admitting: Nurse Practitioner

## 2018-10-05 VITALS — BP 144/81 | HR 68 | Ht 67.0 in | Wt 150.2 lb

## 2018-10-05 DIAGNOSIS — G250 Essential tremor: Secondary | ICD-10-CM | POA: Diagnosis not present

## 2018-10-05 NOTE — Progress Notes (Signed)
I have read the note, and I agree with the clinical assessment and plan.  Omari Koslosky K Crixus Mcaulay   

## 2018-10-05 NOTE — Patient Instructions (Addendum)
Tremor affects eating suggest weighted utensils Given information of essential tremor Will delay starting medication at this time per pt request F/U in 6 months   Essential Tremor A tremor is trembling or shaking that a person cannot control. Most tremors affect the hands or arms. Tremors can also affect the head, vocal cords, legs, and other parts of the body. Essential tremor is a tremor without a known cause. Usually, it occurs while a person is trying to perform an action. It tends to get worse gradually as a person ages. What are the causes? The cause of this condition is not known. What increases the risk? You are more likely to develop this condition if:  You have a family member with essential tremor.  You are age 38 or older.  You take certain medicines. What are the signs or symptoms? The main sign of a tremor is a rhythmic shaking of certain parts of your body that is uncontrolled and unintentional. You may:  Have difficulty eating with a spoon or fork.  Have difficulty writing.  Nod your head up and down or side to side.  Have a quivering voice. The shaking may:  Get worse over time.  Come and go.  Be more noticeable on one side of your body.  Get worse due to stress, fatigue, caffeine, and extreme heat or cold. How is this diagnosed? This condition may be diagnosed based on:  Your symptoms and medical history.  A physical exam. There is no single test to diagnose an essential tremor. However, your health care provider may order tests to rule out other causes of your condition. These may include:  Blood and urine tests.  Imaging studies of your brain, such as CT scan and MRI.  A test that measures involuntary muscle movement (electromyogram). How is this treated? Treatment for essential tremor depends on the severity of the condition.  Some tremors may go away without treatment.  Mild tremors may not need treatment if they do not affect your  day-to-day life.  Severe tremors may need to be treated using one or more of the following options: ? Medicines. ? Lifestyle changes. ? Occupational or physical therapy. Follow these instructions at home: Lifestyle   Do not use any products that contain nicotine or tobacco, such as cigarettes and e-cigarettes. If you need help quitting, ask your health care provider.  Limit your caffeine intake as told by your health care provider.  Try to get 8 hours of sleep each night.  Find ways to manage your stress that fits your lifestyle and personality. Consider trying meditation or yoga.  Try to anticipate stressful situations and allow extra time to manage them.  If you are struggling emotionally with the effects of your tremor, consider working with a mental health provider. General instructions  Take over-the-counter and prescription medicines only as told by your health care provider.  Avoid extreme heat and extreme cold.  Keep all follow-up visits as told by your health care provider. This is important. Visits may include physical therapy visits. Contact a health care provider if:  You experience any changes in the location or intensity of your tremors.  You start having a tremor after starting a new medicine.  You have tremor with other symptoms, such as: ? Numbness. ? Tingling. ? Pain. ? Weakness.  Your tremor gets worse.  Your tremor interferes with your daily life.  You feel down, blue, or sad for at least 2 weeks in a row.  Worrying about  your tremor and what other people think about you interferes with your everyday life functions, including relationships, work, or school. Summary  Essential tremor is a tremor without a known cause. Usually, it occurs when you are trying to perform an action.  The cause of this condition is not known.  The main sign of a tremor is a rhythmic shaking of certain parts of your body that is uncontrolled and  unintentional.  Treatment for essential tremor depends on the severity of the condition. This information is not intended to replace advice given to you by your health care provider. Make sure you discuss any questions you have with your health care provider. Document Released: 09/27/2014 Document Revised: 09/16/2017 Document Reviewed: 09/16/2017 Elsevier Interactive Patient Education  2019 [employer].

## 2018-10-16 ENCOUNTER — Other Ambulatory Visit (HOSPITAL_COMMUNITY): Payer: Self-pay | Admitting: Pulmonary Disease

## 2018-10-16 DIAGNOSIS — Z1231 Encounter for screening mammogram for malignant neoplasm of breast: Secondary | ICD-10-CM

## 2018-10-30 ENCOUNTER — Ambulatory Visit (HOSPITAL_COMMUNITY)
Admission: RE | Admit: 2018-10-30 | Discharge: 2018-10-30 | Disposition: A | Discharge status: Home / Self Care | Payer: Medicare FFS | Source: Ambulatory Visit | Attending: Pulmonary Disease | Admitting: Pulmonary Disease

## 2018-10-30 DIAGNOSIS — Z1231 Encounter for screening mammogram for malignant neoplasm of breast: Secondary | ICD-10-CM

## 2018-10-30 LAB — HM MAMMOGRAPHY

## 2019-04-05 ENCOUNTER — Ambulatory Visit: Payer: Medicare FFS | Admitting: Neurology

## 2019-08-24 ENCOUNTER — Other Ambulatory Visit: Payer: Self-pay

## 2019-11-08 ENCOUNTER — Ambulatory Visit: Payer: Medicare FFS | Admitting: Family Medicine

## 2020-03-05 ENCOUNTER — Ambulatory Visit: Payer: Medicare FFS | Admitting: Family Medicine

## 2022-01-29 DIAGNOSIS — Z8249 Family history of ischemic heart disease and other diseases of the circulatory system: Secondary | ICD-10-CM | POA: Diagnosis not present

## 2022-01-29 DIAGNOSIS — R32 Unspecified urinary incontinence: Secondary | ICD-10-CM | POA: Diagnosis not present

## 2022-01-29 DIAGNOSIS — Z87891 Personal history of nicotine dependence: Secondary | ICD-10-CM | POA: Diagnosis not present

## 2022-01-29 DIAGNOSIS — Z008 Encounter for other general examination: Secondary | ICD-10-CM | POA: Diagnosis not present

## 2022-01-29 DIAGNOSIS — Z809 Family history of malignant neoplasm, unspecified: Secondary | ICD-10-CM | POA: Diagnosis not present

## 2022-01-29 DIAGNOSIS — Z7982 Long term (current) use of aspirin: Secondary | ICD-10-CM | POA: Diagnosis not present

## 2022-01-29 DIAGNOSIS — E785 Hyperlipidemia, unspecified: Secondary | ICD-10-CM | POA: Diagnosis not present

## 2022-02-18 DIAGNOSIS — D529 Folate deficiency anemia, unspecified: Secondary | ICD-10-CM | POA: Diagnosis not present

## 2022-02-18 DIAGNOSIS — D649 Anemia, unspecified: Secondary | ICD-10-CM | POA: Diagnosis not present

## 2022-02-18 DIAGNOSIS — D519 Vitamin B12 deficiency anemia, unspecified: Secondary | ICD-10-CM | POA: Diagnosis not present

## 2022-02-18 DIAGNOSIS — N182 Chronic kidney disease, stage 2 (mild): Secondary | ICD-10-CM | POA: Diagnosis not present

## 2022-02-18 DIAGNOSIS — E785 Hyperlipidemia, unspecified: Secondary | ICD-10-CM | POA: Diagnosis not present

## 2022-02-18 DIAGNOSIS — R5383 Other fatigue: Secondary | ICD-10-CM | POA: Diagnosis not present

## 2022-02-23 DIAGNOSIS — Z1389 Encounter for screening for other disorder: Secondary | ICD-10-CM | POA: Diagnosis not present

## 2022-02-23 DIAGNOSIS — Z6822 Body mass index (BMI) 22.0-22.9, adult: Secondary | ICD-10-CM | POA: Diagnosis not present

## 2022-02-23 DIAGNOSIS — E785 Hyperlipidemia, unspecified: Secondary | ICD-10-CM | POA: Diagnosis not present

## 2022-02-23 DIAGNOSIS — Z1331 Encounter for screening for depression: Secondary | ICD-10-CM | POA: Diagnosis not present

## 2022-02-23 DIAGNOSIS — N182 Chronic kidney disease, stage 2 (mild): Secondary | ICD-10-CM | POA: Diagnosis not present

## 2022-05-10 DIAGNOSIS — H01002 Unspecified blepharitis right lower eyelid: Secondary | ICD-10-CM | POA: Diagnosis not present

## 2022-05-10 DIAGNOSIS — H25813 Combined forms of age-related cataract, bilateral: Secondary | ICD-10-CM | POA: Diagnosis not present

## 2022-05-10 DIAGNOSIS — H01001 Unspecified blepharitis right upper eyelid: Secondary | ICD-10-CM | POA: Diagnosis not present

## 2022-05-10 DIAGNOSIS — H01004 Unspecified blepharitis left upper eyelid: Secondary | ICD-10-CM | POA: Diagnosis not present

## 2022-05-10 DIAGNOSIS — H25812 Combined forms of age-related cataract, left eye: Secondary | ICD-10-CM | POA: Diagnosis not present

## 2022-05-12 ENCOUNTER — Other Ambulatory Visit: Payer: Self-pay

## 2022-05-12 ENCOUNTER — Encounter (HOSPITAL_COMMUNITY): Payer: Self-pay

## 2022-05-12 ENCOUNTER — Encounter (HOSPITAL_COMMUNITY)
Admission: RE | Admit: 2022-05-12 | Discharge: 2022-05-12 | Disposition: A | Discharge status: Home / Self Care | Payer: Medicare HMO | Source: Ambulatory Visit | Attending: Ophthalmology | Admitting: Ophthalmology

## 2022-05-14 NOTE — H&P (Signed)
Surgical History & Physical  Patient Name: Alicia Huynh DOB: 08-01-1941  Surgery: Cataract extraction with intraocular lens implant phacoemulsification; Left Eye  Surgeon: Baruch Goldmann MD Surgery Date:  05-17-22 Pre-Op Date:  05-10-22  HPI: A 2 Yr. old female patient is referred by Dr Alicia Huynh for4 cataract eval. 1. 1. The patient complains of difficulty when seeing street signs/seeing s,all caption on TV, which began 1 year ago. Both eyes are affected. The episode is gradual. The condition's severity increased since last visit. Symptoms occur when the patient is driving, outside and reading. This is negatively affecting the patient's quality of life and the patient is unable to function adequately in life with the current level of vision. HPI was performed by Baruch Goldmann .  Medical History: Cataract  Review of Systems  Negative Allergic/Immunologic Negative Cardiovascular Negative Constitutional Negative Ear, Nose, Mouth & Throat Negative Endocrine Negative Eyes Negative Gastrointestinal Negative Genitourinary Negative Hemotologic/Lymphatic Negative Integumentary Negative Musculoskeletal Negative Neurological Negative Psychiatry Negative Respiratory  Social   Former smoker    Medication  Pravastatin, Aspirin,   Sx/Procedures  Lung Cancer Sx,   Drug Allergies  Codeine,   History & Physical: [name]: Cataract, left eye NECK: supple without bruits LUNGS: lungs clear to auscultation CV: regular rate and rhythm Abdomen: soft and non-tender Impression & Plan: Assessment: 1.  COMBINED FORMS AGE RELATED CATARACT; Both Eyes (H25.813) 2.  BLEPHARITIS; Right Upper Lid, Right Lower Lid, Left Upper Lid, Left Lower Lid (H01.001, H01.002,H01.004,H01.005) 3.  DERMATOCHALASIS, no surgery; Right Upper Lid, Left Upper Lid (H02.831, H02.834) 4.  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  Cataract accounts for the patient's decreased vision. This visual impairment is  not correctable with a tolerable change in glasses or contact lenses. Cataract surgery with an implantation of a new lens should significantly improve the visual and functional status of the patient. Discussed all risks, benefits, alternatives, and potential complications. Discussed the procedures and recovery. Patient desires to have surgery. A-scan ordered and performed today for intra-ocular lens calculations. The surgery will be performed in order to improve vision for driving, reading, and for eye examinations. Recommend phacoemulsification with intra-ocular lens. Recommend Dextenza for post-operative pain and inflammation. Left Eye. Dilates well - shugarcaine by protocol. Patient has significant tremor - worse with stress. Will likely require moderate sedation to minimize tremor.  2.  Recommend regular lid cleaning  3.  Asymptomatic, recommend observation for now. Findings, prognosis and treatment options reviewed.  4.  Recommend Toric IOL OD only.

## 2022-05-17 ENCOUNTER — Ambulatory Visit (HOSPITAL_COMMUNITY)
Admission: RE | Admit: 2022-05-17 | Discharge: 2022-05-17 | Disposition: A | Discharge status: Home / Self Care | Payer: Medicare HMO | Attending: Ophthalmology | Admitting: Ophthalmology

## 2022-05-17 ENCOUNTER — Encounter (HOSPITAL_COMMUNITY): Payer: Self-pay | Admitting: Ophthalmology

## 2022-05-17 ENCOUNTER — Ambulatory Visit (HOSPITAL_BASED_OUTPATIENT_CLINIC_OR_DEPARTMENT_OTHER): Payer: Medicare HMO | Admitting: Anesthesiology

## 2022-05-17 ENCOUNTER — Ambulatory Visit (HOSPITAL_COMMUNITY): Payer: Medicare HMO | Admitting: Anesthesiology

## 2022-05-17 ENCOUNTER — Other Ambulatory Visit: Payer: Self-pay

## 2022-05-17 ENCOUNTER — Encounter (HOSPITAL_COMMUNITY)
Admission: RE | Disposition: A | Discharge status: Home / Self Care | Payer: Self-pay | Source: Home / Self Care | Attending: Ophthalmology

## 2022-05-17 DIAGNOSIS — Z87891 Personal history of nicotine dependence: Secondary | ICD-10-CM

## 2022-05-17 DIAGNOSIS — J449 Chronic obstructive pulmonary disease, unspecified: Secondary | ICD-10-CM

## 2022-05-17 DIAGNOSIS — H02831 Dermatochalasis of right upper eyelid: Secondary | ICD-10-CM | POA: Insufficient documentation

## 2022-05-17 DIAGNOSIS — H0100B Unspecified blepharitis left eye, upper and lower eyelids: Secondary | ICD-10-CM | POA: Diagnosis not present

## 2022-05-17 DIAGNOSIS — H2512 Age-related nuclear cataract, left eye: Secondary | ICD-10-CM | POA: Insufficient documentation

## 2022-05-17 DIAGNOSIS — H0100A Unspecified blepharitis right eye, upper and lower eyelids: Secondary | ICD-10-CM | POA: Diagnosis not present

## 2022-05-17 DIAGNOSIS — H25812 Combined forms of age-related cataract, left eye: Secondary | ICD-10-CM

## 2022-05-17 DIAGNOSIS — H52221 Regular astigmatism, right eye: Secondary | ICD-10-CM | POA: Insufficient documentation

## 2022-05-17 DIAGNOSIS — H02834 Dermatochalasis of left upper eyelid: Secondary | ICD-10-CM | POA: Diagnosis not present

## 2022-05-17 HISTORY — DX: Tremor, unspecified: R25.1

## 2022-05-17 HISTORY — PX: CATARACT EXTRACTION W/PHACO: SHX586

## 2022-05-17 SURGERY — PHACOEMULSIFICATION, CATARACT, WITH IOL INSERTION
Anesthesia: Monitor Anesthesia Care | Site: Eye | Laterality: Left

## 2022-05-17 MED ORDER — NEOMYCIN-POLYMYXIN-DEXAMETH 3.5-10000-0.1 OP SUSP
OPHTHALMIC | Status: DC | PRN
  Administered 2022-05-17: 2 [drp] via OPHTHALMIC

## 2022-05-17 MED ORDER — LIDOCAINE HCL (PF) 1 % IJ SOLN
INTRAOCULAR | Status: DC | PRN
  Administered 2022-05-17: 1 mL via OPHTHALMIC

## 2022-05-17 MED ORDER — SODIUM HYALURONATE 10 MG/ML IO SOLUTION
PREFILLED_SYRINGE | INTRAOCULAR | Status: DC | PRN
  Administered 2022-05-17: 0.85 mL via INTRAOCULAR

## 2022-05-17 MED ORDER — MIDAZOLAM HCL 5 MG/5ML IJ SOLN
INTRAMUSCULAR | Status: DC | PRN
  Administered 2022-05-17: 1 mg via INTRAVENOUS

## 2022-05-17 MED ORDER — TROPICAMIDE 1 % OP SOLN
1.0000 [drp] | OPHTHALMIC | Status: AC | PRN
  Administered 2022-05-17 (×3): 1 [drp] via OPHTHALMIC

## 2022-05-17 MED ORDER — FENTANYL CITRATE (PF) 100 MCG/2ML IJ SOLN
INTRAMUSCULAR | Status: AC
  Filled 2022-05-17: qty 2

## 2022-05-17 MED ORDER — BSS IO SOLN
INTRAOCULAR | Status: DC | PRN
  Administered 2022-05-17: 15 mL via INTRAOCULAR

## 2022-05-17 MED ORDER — STERILE WATER FOR IRRIGATION IR SOLN
Status: DC | PRN
  Administered 2022-05-17: 500 mL

## 2022-05-17 MED ORDER — EPINEPHRINE PF 1 MG/ML IJ SOLN
INTRAOCULAR | Status: DC | PRN
  Administered 2022-05-17: 500 mL

## 2022-05-17 MED ORDER — FENTANYL CITRATE (PF) 100 MCG/2ML IJ SOLN
INTRAMUSCULAR | Status: DC | PRN
  Administered 2022-05-17: 50 ug via INTRAVENOUS

## 2022-05-17 MED ORDER — TETRACAINE HCL 0.5 % OP SOLN
1.0000 [drp] | OPHTHALMIC | Status: AC | PRN
  Administered 2022-05-17 (×3): 1 [drp] via OPHTHALMIC

## 2022-05-17 MED ORDER — EPINEPHRINE PF 1 MG/ML IJ SOLN
INTRAMUSCULAR | Status: AC
  Filled 2022-05-17: qty 2

## 2022-05-17 MED ORDER — PHENYLEPHRINE HCL 2.5 % OP SOLN
1.0000 [drp] | OPHTHALMIC | Status: AC | PRN
  Administered 2022-05-17 (×3): 1 [drp] via OPHTHALMIC

## 2022-05-17 MED ORDER — MIDAZOLAM HCL 2 MG/2ML IJ SOLN
INTRAMUSCULAR | Status: AC
  Filled 2022-05-17: qty 2

## 2022-05-17 MED ORDER — EPHEDRINE SULFATE (PRESSORS) 50 MG/ML IJ SOLN
INTRAMUSCULAR | Status: DC | PRN
  Administered 2022-05-17: 10 mg via INTRAVENOUS

## 2022-05-17 MED ORDER — PROPOFOL 10 MG/ML IV BOLUS
INTRAVENOUS | Status: AC
  Filled 2022-05-17: qty 20

## 2022-05-17 MED ORDER — POVIDONE-IODINE 5 % OP SOLN
OPHTHALMIC | Status: DC | PRN
  Administered 2022-05-17: 1 via OPHTHALMIC

## 2022-05-17 MED ORDER — SODIUM HYALURONATE 23MG/ML IO SOSY
PREFILLED_SYRINGE | INTRAOCULAR | Status: DC | PRN
  Administered 2022-05-17: 0.6 mL via INTRAOCULAR

## 2022-05-17 MED ORDER — PROPOFOL 10 MG/ML IV BOLUS
INTRAVENOUS | Status: DC | PRN
  Administered 2022-05-17 (×4): 10 mg via INTRAVENOUS
  Administered 2022-05-17: 30 mg via INTRAVENOUS
  Administered 2022-05-17: 10 mg via INTRAVENOUS

## 2022-05-17 MED ORDER — LIDOCAINE HCL 3.5 % OP GEL: 1.0000 | Freq: Once | OPHTHALMIC | Status: DC

## 2022-05-17 SURGICAL SUPPLY — 19 items
CATARACT SUITE SIGHTPATH (MISCELLANEOUS) ×1 IMPLANT
CLOTH BEACON ORANGE TIMEOUT ST (SAFETY) ×1 IMPLANT
EYE SHIELD UNIVERSAL CLEAR (GAUZE/BANDAGES/DRESSINGS) IMPLANT
FEE CATARACT SUITE SIGHTPATH (MISCELLANEOUS) ×1 IMPLANT
GLOVE BIOGEL PI IND STRL 6.5 (GLOVE) IMPLANT
GLOVE BIOGEL PI IND STRL 7.0 (GLOVE) ×2 IMPLANT
GLOVE BIOGEL PI INDICATOR 6.5 (GLOVE) ×1
GLOVE BIOGEL PI INDICATOR 7.0 (GLOVE) ×2
GLOVE ECLIPSE 6.0 STRL STRAW (GLOVE) IMPLANT
LENS IOL RAYNER 22.0 (Intraocular Lens) ×1 IMPLANT
LENS IOL RAYONE EMV 22.0 (Intraocular Lens) IMPLANT
NDL HYPO 18GX1.5 BLUNT FILL (NEEDLE) IMPLANT
NEEDLE HYPO 18GX1.5 BLUNT FILL (NEEDLE) ×1 IMPLANT
PAD ARMBOARD 7.5X6 YLW CONV (MISCELLANEOUS) ×1 IMPLANT
RING MALYGIN 7.0 (MISCELLANEOUS) IMPLANT
SYR TB 1ML LL NO SAFETY (SYRINGE) ×1 IMPLANT
TAPE SURG TRANSPORE 1 IN (GAUZE/BANDAGES/DRESSINGS) IMPLANT
TAPE SURGICAL TRANSPORE 1 IN (GAUZE/BANDAGES/DRESSINGS) ×1
WATER STERILE IRR 500ML POUR (IV SOLUTION) IMPLANT

## 2022-05-17 NOTE — Interval H&P Note (Signed)
History and Physical Interval Note:  05/17/2022 2:20 PM  Alicia Huynh  has presented today for surgery, with the diagnosis of combined forms age related cataract; left.  The various methods of treatment have been discussed with the patient and family. After consideration of risks, benefits and other options for treatment, the patient has consented to  Procedure(s): CATARACT EXTRACTION PHACO AND INTRAOCULAR LENS PLACEMENT ([place]) (Left) as a surgical intervention.  The patient's history has been reviewed, patient examined, no change in status, stable for surgery.  I have reviewed the patient's chart and labs.  Questions were answered to the patient's satisfaction.     Baruch Goldmann

## 2022-05-17 NOTE — Anesthesia Preprocedure Evaluation (Signed)
Anesthesia Evaluation  Patient identified by MRN, date of birth, ID band Patient awake    Reviewed: Allergy & Precautions, H&P , NPO status , Patient's Chart, lab work & pertinent test results, reviewed documented beta blocker date and time   Airway Mallampati: II  TM Distance: >3 FB Neck ROM: full    Dental no notable dental hx.    Pulmonary COPD,  COPD inhaler, former smoker,    Pulmonary exam normal breath sounds clear to auscultation       Cardiovascular Exercise Tolerance: Good negative cardio ROS   Rhythm:regular Rate:Normal     Neuro/Psych negative neurological ROS  negative psych ROS   GI/Hepatic negative GI ROS, Neg liver ROS,   Endo/Other  negative endocrine ROS  Renal/GU negative Renal ROS  negative genitourinary   Musculoskeletal   Abdominal   Peds  Hematology negative hematology ROS (+)   Anesthesia Other Findings   Reproductive/Obstetrics negative OB ROS                             Anesthesia Physical Anesthesia Plan  ASA: 3  Anesthesia Plan: MAC   Post-op Pain Management:    Induction:   PONV Risk Score and Plan:   Airway Management Planned:   Additional Equipment:   Intra-op Plan:   Post-operative Plan:   Informed Consent: I have reviewed the patients History and Physical, chart, labs and discussed the procedure including the risks, benefits and alternatives for the proposed anesthesia with the patient or authorized representative who has indicated his/her understanding and acceptance.     Dental Advisory Given  Plan Discussed with: CRNA  Anesthesia Plan Comments:         Anesthesia Quick Evaluation

## 2022-05-17 NOTE — Discharge Instructions (Addendum)
Please discharge patient when stable, will follow up today with Dr. Wrzosek at the [facility] office immediately following discharge.  Leave shield in place until visit.  All paperwork with discharge instructions will be given at the office.  [facility] Address:  [address]  [place], [place] [address]  

## 2022-05-17 NOTE — Op Note (Signed)
Date of procedure: 05/17/22  Pre-operative diagnosis: Visually significant age-related combined cataract, Left Eye (H25.812)  Post-operative diagnosis: Visually significant age-related combined cataract, Left Eye (H25.812)  Procedure: Removal of cataract via phacoemulsification and insertion of intra-ocular lens Rayner [id number] +22.0D into the capsular bag of the Left Eye  Attending surgeon: Gerda Diss. Oneka Parada, MD, MA  Anesthesia: MAC, Topical Akten  Complications: None  Estimated Blood Loss: <23m (minimal)  Specimens: None  Implants: As above  Indications:  Visually significant age-related cataract, Left Eye  Procedure:  The patient was seen and identified in the pre-operative area. The operative eye was identified and dilated.  The operative eye was marked.  Topical anesthesia was administered to the operative eye.     The patient was then to the operative suite and placed in the supine position.  A timeout was performed confirming the patient, procedure to be performed, and all other relevant information.   The patient's face was prepped and draped in the usual fashion for intra-ocular surgery.  A lid speculum was placed into the operative eye and the surgical microscope moved into place and focused.  An inferotemporal paracentesis was created using a 20 gauge paracentesis blade.  Shugarcaine was injected into the anterior chamber.  Viscoelastic was injected into the anterior chamber.  A temporal clear-corneal main wound incision was created using a 2.468mmicrokeratome.  A continuous curvilinear capsulorrhexis was initiated using an irrigating cystitome and completed using capsulorrhexis forceps.  Hydrodissection and hydrodeliniation were performed.  Viscoelastic was injected into the anterior chamber.  A phacoemulsification handpiece and a chopper as a second instrument were used to remove the nucleus and epinucleus. The irrigation/aspiration handpiece was used to remove any remaining  cortical material.   The capsular bag was reinflated with viscoelastic, checked, and found to be intact.  The intraocular lens was inserted into the capsular bag.  The irrigation/aspiration handpiece was used to remove any remaining viscoelastic.  The clear corneal wound and paracentesis wounds were then hydrated and checked with Weck-Cels to be watertight.  Maxitrol was instilled in the eye. The lid-speculum was removed.  The drape was removed.  The patient's face was cleaned with a wet and dry 4x4.    A clear shield was taped over the eye. The patient was taken to the post-operative care unit in good condition, having tolerated the procedure well.  Post-Op Instructions: [facility] and will receive all other orders and instructions.

## 2022-05-17 NOTE — Transfer of Care (Signed)
Immediate Anesthesia Transfer of Care Note  Patient: NIQUITA DIGIOIA  Procedure(s) Performed: CATARACT EXTRACTION PHACO AND INTRAOCULAR LENS PLACEMENT ([facility]) (Left: Eye)  Patient Location: Short Stay  Anesthesia Type:General  Level of Consciousness: sedated  Airway & Oxygen Therapy: Patient Spontanous Breathing  Post-op Assessment: Report given to RN and Post -op Vital signs reviewed and stable  Post vital signs: Reviewed and stable  Last Vitals:  Vitals Value Taken Time  BP 100/56   Temp 36   Pulse 60   Resp 16   SpO2 96     Last Pain:  Vitals:   05/17/22 1309  TempSrc: Oral  PainSc: 0-No pain      Patients Stated Pain Goal: 5 (79/39/03 0092)  Complications: No notable events documented.

## 2022-05-19 NOTE — Anesthesia Postprocedure Evaluation (Signed)
Anesthesia Post Note  Patient: Alicia Huynh  Procedure(s) Performed: CATARACT EXTRACTION PHACO AND INTRAOCULAR LENS PLACEMENT ([facility]) (Left: Eye)  Patient location during evaluation: Phase II Anesthesia Type: MAC Level of consciousness: awake Pain management: pain level controlled Vital Signs Assessment: post-procedure vital signs reviewed and stable Respiratory status: spontaneous breathing and respiratory function stable Cardiovascular status: blood pressure returned to baseline and stable Postop Assessment: no headache and no apparent nausea or vomiting Anesthetic complications: no Comments: Late entry   No notable events documented.   Last Vitals:  Vitals:   05/17/22 1451 05/17/22 1457  BP: 97/60 100/67  Pulse: 65   Resp: 10   Temp: 36.6 C   SpO2: 100%     Last Pain:  Vitals:   05/18/22 1419  TempSrc:   PainSc: 0-No pain                 Louann Sjogren

## 2022-05-20 ENCOUNTER — Encounter (HOSPITAL_COMMUNITY): Payer: Self-pay | Admitting: Ophthalmology

## 2022-05-26 ENCOUNTER — Encounter (HOSPITAL_COMMUNITY)
Admission: RE | Admit: 2022-05-26 | Discharge: 2022-05-26 | Disposition: A | Discharge status: Home / Self Care | Payer: Medicare HMO | Source: Ambulatory Visit | Attending: Ophthalmology | Admitting: Ophthalmology

## 2022-05-27 DIAGNOSIS — H25811 Combined forms of age-related cataract, right eye: Secondary | ICD-10-CM | POA: Diagnosis not present

## 2022-05-28 ENCOUNTER — Encounter (HOSPITAL_COMMUNITY): Payer: Self-pay

## 2022-05-28 ENCOUNTER — Other Ambulatory Visit: Payer: Self-pay

## 2022-05-28 NOTE — H&P (Signed)
Surgical History & Physical  Patient Name: Alicia Huynh DOB: 01/25/1941  Surgery: Cataract extraction with intraocular lens implant phacoemulsification; Right Eye  Surgeon: Baruch Goldmann MD Surgery Date:  05-31-22 Pre-Op Date:  05-27-22  HPI: A 73 Yr. old female patient 1. The patient is returning after cataract post-op. The left eye is affected. Status post cataract post-op, which began 1 week ago: Since the last visit, the affected area is doing well. The patient's vision is improved. Patient is following medication instructions. 2. 2. The patient complains of difficulty when viewing TV, reading closed caption, news scrolls on TV, which began 1 year ago. The right eye is affected. The episode is gradual. The condition's severity increased since last visit. Symptoms occur when the patient is inside and outside. This is negatively affecting the patient's quality of life and the patient is unable to function adequately in life with the current level of vision. HPI was performed by Baruch Goldmann .  Medical History: Cataract  Review of Systems Negative Allergic/Immunologic Negative Cardiovascular Negative Constitutional Negative Ear, Nose, Mouth & Throat Negative Endocrine Negative Eyes Negative Gastrointestinal Negative Genitourinary Negative Hemotologic/Lymphatic Negative Integumentary Negative Musculoskeletal Negative Neurological Negative Psychiatry Negative Respiratory  Social   Former smoker    Medication Prednisolone-Moxifloxacin-Bromfenac,  Pravastatin, Aspirin,   Sx/Procedures Phaco c IOL OS,  Lung Cancer Sx,   Drug Allergies  Codeine,   History & Physical: [name]: Cataract, right eye NECK: supple without bruits LUNGS: lungs clear to auscultation CV: regular rate and rhythm Abdomen: soft and non-tender Impression & Plan: Assessment: 1.  CATARACT EXTRACTION STATUS; Left Eye (Z98.42) 2.  COMBINED FORMS AGE RELATED CATARACT; Both Eyes (H25.813) 3.   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  1 week after cataract surgery. Doing well with improved vision and normal eye pressure. Call with any problems or concerns. Continue Pred-Moxi-Brom 2x/day for 3 more weeks.  2.  Cataract accounts for the patient's decreased vision. This visual impairment is not correctable with a tolerable change in glasses or contact lenses. Cataract surgery with an implantation of a new lens should significantly improve the visual and functional status of the patient. Discussed all risks, benefits, alternatives, and potential complications. Discussed the procedures and recovery. Patient desires to have surgery. A-scan ordered and performed today for intra-ocular lens calculations. The surgery will be performed in order to improve vision for driving, reading, and for eye examinations. Recommend phacoemulsification with intra-ocular lens. Recommend Dextenza for post-operative pain and inflammation. Right Eye. Surgery required to correct imbalance of vision. Dilates well - shugarcaine by protocol. Moderate Sedation with Propofol as per 1st eye - significant tremor. Recommend Toric IOL - declines.  3.  Recommend Toric IOL OD only - declines.

## 2022-05-31 ENCOUNTER — Encounter (HOSPITAL_COMMUNITY)
Admission: RE | Disposition: A | Discharge status: Home / Self Care | Payer: Self-pay | Source: Home / Self Care | Attending: Ophthalmology

## 2022-05-31 ENCOUNTER — Ambulatory Visit (HOSPITAL_COMMUNITY): Payer: Medicare HMO | Admitting: Anesthesiology

## 2022-05-31 ENCOUNTER — Ambulatory Visit (HOSPITAL_BASED_OUTPATIENT_CLINIC_OR_DEPARTMENT_OTHER): Payer: Medicare HMO | Admitting: Anesthesiology

## 2022-05-31 ENCOUNTER — Encounter (HOSPITAL_COMMUNITY): Payer: Self-pay | Admitting: Ophthalmology

## 2022-05-31 ENCOUNTER — Ambulatory Visit (HOSPITAL_COMMUNITY)
Admission: RE | Admit: 2022-05-31 | Discharge: 2022-05-31 | Disposition: A | Discharge status: Home / Self Care | Payer: Medicare HMO | Attending: Ophthalmology | Admitting: Ophthalmology

## 2022-05-31 DIAGNOSIS — Z87891 Personal history of nicotine dependence: Secondary | ICD-10-CM | POA: Insufficient documentation

## 2022-05-31 DIAGNOSIS — I447 Left bundle-branch block, unspecified: Secondary | ICD-10-CM

## 2022-05-31 DIAGNOSIS — H25811 Combined forms of age-related cataract, right eye: Secondary | ICD-10-CM

## 2022-05-31 DIAGNOSIS — Z902 Acquired absence of lung [part of]: Secondary | ICD-10-CM | POA: Diagnosis not present

## 2022-05-31 DIAGNOSIS — H52221 Regular astigmatism, right eye: Secondary | ICD-10-CM | POA: Insufficient documentation

## 2022-05-31 DIAGNOSIS — I252 Old myocardial infarction: Secondary | ICD-10-CM | POA: Diagnosis not present

## 2022-05-31 DIAGNOSIS — J449 Chronic obstructive pulmonary disease, unspecified: Secondary | ICD-10-CM | POA: Insufficient documentation

## 2022-05-31 DIAGNOSIS — Z9842 Cataract extraction status, left eye: Secondary | ICD-10-CM | POA: Diagnosis not present

## 2022-05-31 DIAGNOSIS — Z85118 Personal history of other malignant neoplasm of bronchus and lung: Secondary | ICD-10-CM | POA: Diagnosis not present

## 2022-05-31 DIAGNOSIS — H259 Unspecified age-related cataract: Secondary | ICD-10-CM

## 2022-05-31 HISTORY — PX: CATARACT EXTRACTION W/PHACO: SHX586

## 2022-05-31 SURGERY — PHACOEMULSIFICATION, CATARACT, WITH IOL INSERTION
Anesthesia: Monitor Anesthesia Care | Site: Eye | Laterality: Right

## 2022-05-31 MED ORDER — SODIUM CHLORIDE 0.9% FLUSH
INTRAVENOUS | Status: DC | PRN
  Administered 2022-05-31 (×2): 3 mL via INTRAVENOUS

## 2022-05-31 MED ORDER — SODIUM HYALURONATE 23MG/ML IO SOSY
PREFILLED_SYRINGE | INTRAOCULAR | Status: DC | PRN
  Administered 2022-05-31: 0.6 mL via INTRAOCULAR

## 2022-05-31 MED ORDER — PROPOFOL 500 MG/50ML IV EMUL
INTRAVENOUS | Status: DC | PRN
  Administered 2022-05-31 (×2): 10 mg via INTRAVENOUS
  Administered 2022-05-31 (×2): 20 mg via INTRAVENOUS

## 2022-05-31 MED ORDER — EPINEPHRINE PF 1 MG/ML IJ SOLN
INTRAOCULAR | Status: DC | PRN
  Administered 2022-05-31: 500 mL

## 2022-05-31 MED ORDER — TROPICAMIDE 1 % OP SOLN
1.0000 [drp] | OPHTHALMIC | Status: AC | PRN
  Administered 2022-05-31 (×3): 1 [drp] via OPHTHALMIC

## 2022-05-31 MED ORDER — BSS IO SOLN
INTRAOCULAR | Status: DC | PRN
  Administered 2022-05-31: 15 mL via INTRAOCULAR

## 2022-05-31 MED ORDER — TETRACAINE HCL 0.5 % OP SOLN
1.0000 [drp] | OPHTHALMIC | Status: AC | PRN
  Administered 2022-05-31 (×3): 1 [drp] via OPHTHALMIC

## 2022-05-31 MED ORDER — STERILE WATER FOR IRRIGATION IR SOLN
Status: DC | PRN
  Administered 2022-05-31: 500 mL

## 2022-05-31 MED ORDER — PROPOFOL 10 MG/ML IV BOLUS
INTRAVENOUS | Status: AC
  Filled 2022-05-31: qty 20

## 2022-05-31 MED ORDER — SODIUM HYALURONATE 10 MG/ML IO SOLUTION
PREFILLED_SYRINGE | INTRAOCULAR | Status: DC | PRN
  Administered 2022-05-31: 0.85 mL via INTRAOCULAR

## 2022-05-31 MED ORDER — FENTANYL CITRATE (PF) 100 MCG/2ML IJ SOLN
INTRAMUSCULAR | Status: DC | PRN
  Administered 2022-05-31: 50 ug via INTRAVENOUS

## 2022-05-31 MED ORDER — EPINEPHRINE PF 1 MG/ML IJ SOLN
INTRAMUSCULAR | Status: AC
  Filled 2022-05-31: qty 2

## 2022-05-31 MED ORDER — MIDAZOLAM HCL 2 MG/2ML IJ SOLN
INTRAMUSCULAR | Status: AC
  Filled 2022-05-31: qty 2

## 2022-05-31 MED ORDER — LIDOCAINE HCL (PF) 1 % IJ SOLN
INTRAOCULAR | Status: DC | PRN
  Administered 2022-05-31: 1 mL via OPHTHALMIC

## 2022-05-31 MED ORDER — MIDAZOLAM HCL 2 MG/2ML IJ SOLN
INTRAMUSCULAR | Status: DC | PRN
  Administered 2022-05-31: 1 mg via INTRAVENOUS

## 2022-05-31 MED ORDER — POVIDONE-IODINE 5 % OP SOLN
OPHTHALMIC | Status: DC | PRN
  Administered 2022-05-31: 1 via OPHTHALMIC

## 2022-05-31 MED ORDER — LIDOCAINE HCL 3.5 % OP GEL
1.0000 | Freq: Once | OPHTHALMIC | Status: AC
  Administered 2022-05-31: 1 via OPHTHALMIC

## 2022-05-31 MED ORDER — FENTANYL CITRATE (PF) 100 MCG/2ML IJ SOLN
INTRAMUSCULAR | Status: AC
  Filled 2022-05-31: qty 2

## 2022-05-31 MED ORDER — MOXIFLOXACIN HCL 0.5 % OP SOLN
OPHTHALMIC | Status: DC | PRN
  Administered 2022-05-31: 1 [drp] via OPHTHALMIC

## 2022-05-31 MED ORDER — PHENYLEPHRINE HCL 2.5 % OP SOLN
1.0000 [drp] | OPHTHALMIC | Status: AC | PRN
  Administered 2022-05-31 (×3): 1 [drp] via OPHTHALMIC

## 2022-05-31 SURGICAL SUPPLY — 16 items
CATARACT SUITE SIGHTPATH (MISCELLANEOUS) ×1 IMPLANT
CLOTH BEACON ORANGE TIMEOUT ST (SAFETY) ×1 IMPLANT
DRAPE HALF SHEET 40X57 (DRAPES) IMPLANT
EYE SHIELD UNIVERSAL CLEAR (GAUZE/BANDAGES/DRESSINGS) IMPLANT
FEE CATARACT SUITE SIGHTPATH (MISCELLANEOUS) ×1 IMPLANT
GLOVE BIOGEL PI IND STRL 7.0 (GLOVE) ×2 IMPLANT
LENS IOL RAYNER 22.0 (Intraocular Lens) ×1 IMPLANT
LENS IOL RAYONE EMV 22.0 (Intraocular Lens) IMPLANT
NDL HYPO 18GX1.5 BLUNT FILL (NEEDLE) ×1 IMPLANT
NEEDLE HYPO 18GX1.5 BLUNT FILL (NEEDLE) ×1 IMPLANT
PAD ARMBOARD 7.5X6 YLW CONV (MISCELLANEOUS) ×1 IMPLANT
RING MALYGIN 7.0 (MISCELLANEOUS) IMPLANT
SYR TB 1ML LL NO SAFETY (SYRINGE) ×1 IMPLANT
TAPE SURG TRANSPORE 1 IN (GAUZE/BANDAGES/DRESSINGS) IMPLANT
TAPE SURGICAL TRANSPORE 1 IN (GAUZE/BANDAGES/DRESSINGS) ×1
WATER STERILE IRR 500ML POUR (IV SOLUTION) IMPLANT

## 2022-05-31 NOTE — Progress Notes (Signed)
22gauge Iv catheter removed from left antecubital, site clean dry intact, no bleeding, pressure 2x2 with paper tape applied.

## 2022-05-31 NOTE — Anesthesia Postprocedure Evaluation (Signed)
Anesthesia Post Note  Patient: Alicia Huynh  Procedure(s) Performed: CATARACT EXTRACTION PHACO AND INTRAOCULAR LENS PLACEMENT (IOC) (Right: Eye)  Patient location during evaluation: Phase II Anesthesia Type: MAC Level of consciousness: awake and alert and oriented Pain management: pain level controlled Vital Signs Assessment: post-procedure vital signs reviewed and stable Respiratory status: spontaneous breathing, nonlabored ventilation and respiratory function stable Cardiovascular status: blood pressure returned to baseline and stable Postop Assessment: no apparent nausea or vomiting Anesthetic complications: no   No notable events documented.   Last Vitals:  Vitals:   05/31/22 0952 05/31/22 0958  BP: (!) 106/52 (!) 102/56  Pulse: (!) 58 62  Resp: 18 16  Temp: 36.6 C   SpO2: 96% 98%    Last Pain:  Vitals:   05/31/22 0958  TempSrc:   PainSc: 0-No pain                 Aylssa Herrig C Elizabet Schweppe

## 2022-05-31 NOTE — Op Note (Signed)
Date of procedure: 05/31/22  Pre-operative diagnosis:  Visually significant combined form age-related cataract, Right Eye (H25.811)  Post-operative diagnosis:  Visually significant combined form age-related cataract, Right Eye (H25.811)  Procedure: Removal of cataract via phacoemulsification and insertion of intra-ocular lens Rayner [id number] +22.0D into the capsular bag of the Right Eye  Attending surgeon: Gerda Diss. Janilah Hojnacki, MD, MA  Anesthesia: MAC, Topical Akten  Complications: None  Estimated Blood Loss: <35m (minimal)  Specimens: None  Implants: As above  Indications:  Visually significant age-related cataract, Right Eye  Procedure:  The patient was seen and identified in the pre-operative area. The operative eye was identified and dilated.  The operative eye was marked.  Topical anesthesia was administered to the operative eye.     The patient was then to the operative suite and placed in the supine position.  A timeout was performed confirming the patient, procedure to be performed, and all other relevant information.   The patient's face was prepped and draped in the usual fashion for intra-ocular surgery.  A lid speculum was placed into the operative eye and the surgical microscope moved into place and focused.  A superotemporal paracentesis was created using a 20 gauge paracentesis blade.  Shugarcaine was injected into the anterior chamber.  Viscoelastic was injected into the anterior chamber.  A temporal clear-corneal main wound incision was created using a 2.415mmicrokeratome.  A continuous curvilinear capsulorrhexis was initiated using an irrigating cystitome and completed using capsulorrhexis forceps.  Hydrodissection and hydrodeliniation were performed.  Viscoelastic was injected into the anterior chamber.  A phacoemulsification handpiece and a chopper as a second instrument were used to remove the nucleus and epinucleus. The irrigation/aspiration handpiece was used to remove any  remaining cortical material.   The capsular bag was reinflated with viscoelastic, checked, and found to be intact.  The intraocular lens was inserted into the capsular bag.  The irrigation/aspiration handpiece was used to remove any remaining viscoelastic.  The clear corneal wound and paracentesis wounds were then hydrated and checked with Weck-Cels to be watertight.  The lid-speculum was removed.  The drape was removed.  The patient's face was cleaned with a wet and dry 4x4.   Maxitrol was instilled in the eye. A clear shield was taped over the eye. The patient was taken to the post-operative care unit in good condition, having tolerated the procedure well.  Post-Op Instructions: [facility] and will receive all other orders and instructions.

## 2022-05-31 NOTE — Anesthesia Preprocedure Evaluation (Signed)
Anesthesia Evaluation  Patient identified by MRN, date of birth, ID band Patient awake    Reviewed: Allergy & Precautions, NPO status , Patient's Chart, lab work & pertinent test results  Airway Mallampati: II  TM Distance: >3 FB Neck ROM: Full    Dental  (+) Lower Dentures, Upper Dentures   Pulmonary COPD, former smoker,  Lung mass 02/2012 right; s/p VATS with RUL lobectomy per Dr. 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    Pulmonary exam normal breath sounds clear to auscultation       Cardiovascular + Past MI  Normal cardiovascular exam+ dysrhythmias  Rhythm:Regular Rate:Normal  LBBB   Neuro/Psych negative neurological ROS  negative psych ROS   GI/Hepatic negative GI ROS, Neg liver ROS,   Endo/Other  negative endocrine ROS  Renal/GU negative Renal ROS  negative genitourinary   Musculoskeletal negative musculoskeletal ROS (+)   Abdominal   Peds negative pediatric ROS (+)  Hematology negative hematology ROS (+)   Anesthesia Other Findings   Reproductive/Obstetrics negative OB ROS                             Anesthesia Physical Anesthesia Plan  ASA: 3  Anesthesia Plan: MAC   Post-op Pain Management: Minimal or no pain anticipated   Induction:   PONV Risk Score and Plan:   Airway Management Planned: Nasal Cannula and Natural Airway  Additional Equipment:   Intra-op Plan:   Post-operative Plan:   Informed Consent: I have reviewed the patients History and Physical, chart, labs and discussed the procedure including the risks, benefits and alternatives for the proposed anesthesia with the patient or authorized representative who has indicated his/her understanding and acceptance.     Dental advisory given  Plan Discussed with: CRNA and Surgeon  Anesthesia Plan Comments:         Anesthesia Quick Evaluation

## 2022-05-31 NOTE — Interval H&P Note (Signed)
History and Physical Interval Note:  05/31/2022 9:25 AM  Alicia Huynh  has presented today for surgery, with the diagnosis of combined forms age related cataract; right.  The various methods of treatment have been discussed with the patient and family. After consideration of risks, benefits and other options for treatment, the patient has consented to  Procedure(s) with comments: CATARACT EXTRACTION PHACO AND INTRAOCULAR LENS PLACEMENT (IOC) (Right) - CDE: as a surgical intervention.  The patient's history has been reviewed, patient examined, no change in status, stable for surgery.  I have reviewed the patient's chart and labs.  Questions were answered to the patient's satisfaction.     Baruch Goldmann

## 2022-05-31 NOTE — Discharge Instructions (Signed)
Please discharge patient when stable, will follow up today with Dr. Oneisha Ammons at the [facility] [place] office immediately following discharge.  Leave shield in place until visit.  All paperwork with discharge instructions will be given at the office.  [facility] Address:  [address]  [place], [place] [address]  

## 2022-05-31 NOTE — Transfer of Care (Signed)
Immediate Anesthesia Transfer of Care Note  Patient: Alicia Huynh  Procedure(s) Performed: CATARACT EXTRACTION PHACO AND INTRAOCULAR LENS PLACEMENT (IOC) (Right: Eye)  Patient Location: PACU  Anesthesia Type:MAC  Level of Consciousness: awake, drowsy and patient cooperative  Airway & Oxygen Therapy: Patient Spontanous Breathing  Post-op Assessment: Report given to RN, Post -op Vital signs reviewed and stable and Patient moving all extremities X 4  Post vital signs: Reviewed and stable  Last Vitals:  Vitals Value Taken Time  BP    Temp    Pulse    Resp    SpO2      Last Pain:  Vitals:   05/31/22 0913  TempSrc: Oral  PainSc: 0-No pain         Complications: No notable events documented.

## 2022-06-03 ENCOUNTER — Encounter (HOSPITAL_COMMUNITY): Payer: Self-pay | Admitting: Ophthalmology

## 2022-08-16 DIAGNOSIS — H524 Presbyopia: Secondary | ICD-10-CM | POA: Diagnosis not present

## 2022-08-20 DIAGNOSIS — E785 Hyperlipidemia, unspecified: Secondary | ICD-10-CM | POA: Diagnosis not present

## 2022-08-20 DIAGNOSIS — N182 Chronic kidney disease, stage 2 (mild): Secondary | ICD-10-CM | POA: Diagnosis not present

## 2022-08-20 DIAGNOSIS — R5383 Other fatigue: Secondary | ICD-10-CM | POA: Diagnosis not present

## 2022-08-25 DIAGNOSIS — N182 Chronic kidney disease, stage 2 (mild): Secondary | ICD-10-CM | POA: Diagnosis not present

## 2022-08-25 DIAGNOSIS — Z6822 Body mass index (BMI) 22.0-22.9, adult: Secondary | ICD-10-CM | POA: Diagnosis not present

## 2022-08-25 DIAGNOSIS — Z9849 Cataract extraction status, unspecified eye: Secondary | ICD-10-CM | POA: Diagnosis not present

## 2022-08-25 DIAGNOSIS — Z0001 Encounter for general adult medical examination with abnormal findings: Secondary | ICD-10-CM | POA: Diagnosis not present

## 2022-08-25 DIAGNOSIS — Z85118 Personal history of other malignant neoplasm of bronchus and lung: Secondary | ICD-10-CM | POA: Diagnosis not present

## 2022-08-25 DIAGNOSIS — R030 Elevated blood-pressure reading, without diagnosis of hypertension: Secondary | ICD-10-CM | POA: Diagnosis not present

## 2022-08-25 DIAGNOSIS — E785 Hyperlipidemia, unspecified: Secondary | ICD-10-CM | POA: Diagnosis not present

## 2022-11-02 DIAGNOSIS — R309 Painful micturition, unspecified: Secondary | ICD-10-CM | POA: Diagnosis not present

## 2022-11-02 DIAGNOSIS — N760 Acute vaginitis: Secondary | ICD-10-CM | POA: Diagnosis not present

## 2022-11-02 DIAGNOSIS — N898 Other specified noninflammatory disorders of vagina: Secondary | ICD-10-CM | POA: Diagnosis not present

## 2023-02-18 DIAGNOSIS — E785 Hyperlipidemia, unspecified: Secondary | ICD-10-CM | POA: Diagnosis not present

## 2023-02-18 DIAGNOSIS — N1831 Chronic kidney disease, stage 3a: Secondary | ICD-10-CM | POA: Diagnosis not present

## 2023-02-18 DIAGNOSIS — Z1329 Encounter for screening for other suspected endocrine disorder: Secondary | ICD-10-CM | POA: Diagnosis not present

## 2023-02-22 DIAGNOSIS — R300 Dysuria: Secondary | ICD-10-CM | POA: Diagnosis not present

## 2023-02-22 DIAGNOSIS — E785 Hyperlipidemia, unspecified: Secondary | ICD-10-CM | POA: Diagnosis not present

## 2023-02-22 DIAGNOSIS — N1831 Chronic kidney disease, stage 3a: Secondary | ICD-10-CM | POA: Diagnosis not present

## 2023-02-22 DIAGNOSIS — R030 Elevated blood-pressure reading, without diagnosis of hypertension: Secondary | ICD-10-CM | POA: Diagnosis not present

## 2023-02-22 DIAGNOSIS — N898 Other specified noninflammatory disorders of vagina: Secondary | ICD-10-CM | POA: Diagnosis not present

## 2023-02-22 DIAGNOSIS — Z85118 Personal history of other malignant neoplasm of bronchus and lung: Secondary | ICD-10-CM | POA: Diagnosis not present

## 2023-02-22 DIAGNOSIS — Z6822 Body mass index (BMI) 22.0-22.9, adult: Secondary | ICD-10-CM | POA: Diagnosis not present

## 2023-02-22 DIAGNOSIS — N952 Postmenopausal atrophic vaginitis: Secondary | ICD-10-CM | POA: Diagnosis not present

## 2023-05-28 DIAGNOSIS — I129 Hypertensive chronic kidney disease with stage 1 through stage 4 chronic kidney disease, or unspecified chronic kidney disease: Secondary | ICD-10-CM | POA: Diagnosis not present

## 2023-05-28 DIAGNOSIS — Z8249 Family history of ischemic heart disease and other diseases of the circulatory system: Secondary | ICD-10-CM | POA: Diagnosis not present

## 2023-05-28 DIAGNOSIS — Z809 Family history of malignant neoplasm, unspecified: Secondary | ICD-10-CM | POA: Diagnosis not present

## 2023-05-28 DIAGNOSIS — Z885 Allergy status to narcotic agent status: Secondary | ICD-10-CM | POA: Diagnosis not present

## 2023-05-28 DIAGNOSIS — J449 Chronic obstructive pulmonary disease, unspecified: Secondary | ICD-10-CM | POA: Diagnosis not present

## 2023-05-28 DIAGNOSIS — Z7982 Long term (current) use of aspirin: Secondary | ICD-10-CM | POA: Diagnosis not present

## 2023-05-28 DIAGNOSIS — R32 Unspecified urinary incontinence: Secondary | ICD-10-CM | POA: Diagnosis not present

## 2023-05-28 DIAGNOSIS — Z87891 Personal history of nicotine dependence: Secondary | ICD-10-CM | POA: Diagnosis not present

## 2023-05-28 DIAGNOSIS — E785 Hyperlipidemia, unspecified: Secondary | ICD-10-CM | POA: Diagnosis not present

## 2023-05-28 DIAGNOSIS — Z85118 Personal history of other malignant neoplasm of bronchus and lung: Secondary | ICD-10-CM | POA: Diagnosis not present

## 2023-05-28 DIAGNOSIS — I2510 Atherosclerotic heart disease of native coronary artery without angina pectoris: Secondary | ICD-10-CM | POA: Diagnosis not present

## 2023-05-28 DIAGNOSIS — N189 Chronic kidney disease, unspecified: Secondary | ICD-10-CM | POA: Diagnosis not present

## 2023-08-01 DIAGNOSIS — Z23 Encounter for immunization: Secondary | ICD-10-CM | POA: Diagnosis not present

## 2023-10-11 DIAGNOSIS — R300 Dysuria: Secondary | ICD-10-CM | POA: Diagnosis not present

## 2023-11-25 DIAGNOSIS — Z0000 Encounter for general adult medical examination without abnormal findings: Secondary | ICD-10-CM | POA: Diagnosis not present

## 2023-11-25 DIAGNOSIS — Z6821 Body mass index (BMI) 21.0-21.9, adult: Secondary | ICD-10-CM | POA: Diagnosis not present

## 2023-11-25 DIAGNOSIS — Z0001 Encounter for general adult medical examination with abnormal findings: Secondary | ICD-10-CM | POA: Diagnosis not present

## 2024-05-14 DIAGNOSIS — Z681 Body mass index (BMI) 19 or less, adult: Secondary | ICD-10-CM | POA: Diagnosis not present

## 2024-05-14 DIAGNOSIS — N182 Chronic kidney disease, stage 2 (mild): Secondary | ICD-10-CM | POA: Diagnosis not present

## 2024-05-14 DIAGNOSIS — E785 Hyperlipidemia, unspecified: Secondary | ICD-10-CM | POA: Diagnosis not present

## 2024-05-14 DIAGNOSIS — Z85118 Personal history of other malignant neoplasm of bronchus and lung: Secondary | ICD-10-CM | POA: Diagnosis not present

## 2024-05-14 DIAGNOSIS — R634 Abnormal weight loss: Secondary | ICD-10-CM | POA: Diagnosis not present

## 2024-08-09 DIAGNOSIS — Z23 Encounter for immunization: Secondary | ICD-10-CM | POA: Diagnosis not present
